# Patient Record
Sex: Female | Born: 1965 | Race: White | Hispanic: No | Marital: Married | Smoking: Never smoker
Health system: Southern US, Community
[De-identification: ages and names within clinical notes are randomized; demographics above are authoritative.]

## PROBLEM LIST (undated history)

## (undated) DIAGNOSIS — K219 Gastro-esophageal reflux disease without esophagitis: Secondary | ICD-10-CM

## (undated) DIAGNOSIS — Q513 Bicornate uterus: Secondary | ICD-10-CM

## (undated) DIAGNOSIS — E079 Disorder of thyroid, unspecified: Secondary | ICD-10-CM

## (undated) DIAGNOSIS — E119 Type 2 diabetes mellitus without complications: Secondary | ICD-10-CM

## (undated) HISTORY — DX: Gastro-esophageal reflux disease without esophagitis: K21.9

## (undated) HISTORY — PX: BREAST SURGERY: SHX581

## (undated) HISTORY — DX: Type 2 diabetes mellitus without complications: E11.9

## (undated) HISTORY — DX: Disorder of thyroid, unspecified: E07.9

## (undated) HISTORY — PX: BREAST EXCISIONAL BIOPSY: SUR124

## (undated) HISTORY — PX: WISDOM TOOTH EXTRACTION: SHX21

## (undated) HISTORY — DX: Bicornate uterus: Q51.3

---

## 1988-08-14 HISTORY — PX: CHOLECYSTECTOMY: SHX55

## 1992-08-14 HISTORY — PX: TUBAL LIGATION: SHX77

## 1994-08-14 HISTORY — PX: ENDOMETRIAL ABLATION: SHX621

## 1998-03-25 ENCOUNTER — Other Ambulatory Visit: Admission: RE | Admit: 1998-03-25 | Discharge: 1998-03-25 | Payer: Self-pay | Admitting: Gynecology

## 2000-01-18 ENCOUNTER — Other Ambulatory Visit: Admission: RE | Admit: 2000-01-18 | Discharge: 2000-01-18 | Payer: Self-pay | Admitting: Gynecology

## 2000-04-27 ENCOUNTER — Encounter: Admission: RE | Admit: 2000-04-27 | Discharge: 2000-04-27 | Payer: Self-pay | Admitting: Family Medicine

## 2000-04-27 ENCOUNTER — Encounter: Payer: Self-pay | Admitting: Family Medicine

## 2000-05-03 ENCOUNTER — Encounter: Payer: Self-pay | Admitting: Family Medicine

## 2000-05-03 ENCOUNTER — Encounter: Admission: RE | Admit: 2000-05-03 | Discharge: 2000-05-03 | Payer: Self-pay | Admitting: Family Medicine

## 2000-10-30 ENCOUNTER — Encounter: Payer: Self-pay | Admitting: Family Medicine

## 2000-10-30 ENCOUNTER — Encounter: Admission: RE | Admit: 2000-10-30 | Discharge: 2000-10-30 | Payer: Self-pay | Admitting: Family Medicine

## 2001-05-07 ENCOUNTER — Other Ambulatory Visit: Admission: RE | Admit: 2001-05-07 | Discharge: 2001-05-07 | Payer: Self-pay | Admitting: Gynecology

## 2001-05-21 ENCOUNTER — Encounter: Admission: RE | Admit: 2001-05-21 | Discharge: 2001-05-21 | Payer: Self-pay | Admitting: Family Medicine

## 2001-05-21 ENCOUNTER — Encounter: Payer: Self-pay | Admitting: Family Medicine

## 2002-07-01 ENCOUNTER — Encounter: Admission: RE | Admit: 2002-07-01 | Discharge: 2002-07-01 | Payer: Self-pay | Admitting: Family Medicine

## 2002-07-01 ENCOUNTER — Encounter: Payer: Self-pay | Admitting: Family Medicine

## 2003-02-18 ENCOUNTER — Other Ambulatory Visit: Admission: RE | Admit: 2003-02-18 | Discharge: 2003-02-18 | Payer: Self-pay | Admitting: Gynecology

## 2003-04-07 ENCOUNTER — Encounter: Admission: RE | Admit: 2003-04-07 | Discharge: 2003-04-07 | Payer: Self-pay | Admitting: Gynecology

## 2003-04-07 ENCOUNTER — Encounter: Payer: Self-pay | Admitting: Gynecology

## 2003-08-05 ENCOUNTER — Encounter: Admission: RE | Admit: 2003-08-05 | Discharge: 2003-08-05 | Payer: Self-pay | Admitting: Gynecology

## 2004-09-07 ENCOUNTER — Other Ambulatory Visit: Admission: RE | Admit: 2004-09-07 | Discharge: 2004-09-07 | Payer: Self-pay | Admitting: Gynecology

## 2004-09-13 ENCOUNTER — Encounter: Admission: RE | Admit: 2004-09-13 | Discharge: 2004-09-13 | Payer: Self-pay | Admitting: Gynecology

## 2005-09-19 ENCOUNTER — Encounter: Admission: RE | Admit: 2005-09-19 | Discharge: 2005-09-19 | Payer: Self-pay | Admitting: Family Medicine

## 2005-10-04 ENCOUNTER — Other Ambulatory Visit: Admission: RE | Admit: 2005-10-04 | Discharge: 2005-10-04 | Payer: Self-pay | Admitting: Gynecology

## 2006-10-17 ENCOUNTER — Encounter: Admission: RE | Admit: 2006-10-17 | Discharge: 2006-10-17 | Payer: Self-pay | Admitting: Family Medicine

## 2006-10-17 ENCOUNTER — Other Ambulatory Visit: Admission: RE | Admit: 2006-10-17 | Discharge: 2006-10-17 | Payer: Self-pay | Admitting: Gynecology

## 2006-11-21 ENCOUNTER — Encounter: Admission: RE | Admit: 2006-11-21 | Discharge: 2006-11-21 | Payer: Self-pay | Admitting: Family Medicine

## 2007-10-22 ENCOUNTER — Encounter: Admission: RE | Admit: 2007-10-22 | Discharge: 2007-10-22 | Payer: Self-pay | Admitting: Family Medicine

## 2007-11-01 ENCOUNTER — Other Ambulatory Visit: Admission: RE | Admit: 2007-11-01 | Discharge: 2007-11-01 | Payer: Self-pay | Admitting: Gynecology

## 2008-11-24 ENCOUNTER — Encounter: Admission: RE | Admit: 2008-11-24 | Discharge: 2008-11-24 | Payer: Self-pay | Admitting: Family Medicine

## 2008-12-11 ENCOUNTER — Encounter: Payer: Self-pay | Admitting: Women's Health

## 2008-12-11 ENCOUNTER — Other Ambulatory Visit: Admission: RE | Admit: 2008-12-11 | Discharge: 2008-12-11 | Payer: Self-pay | Admitting: Gynecology

## 2008-12-11 ENCOUNTER — Ambulatory Visit: Payer: Self-pay | Admitting: Women's Health

## 2009-03-31 ENCOUNTER — Encounter: Admission: RE | Admit: 2009-03-31 | Discharge: 2009-03-31 | Payer: Self-pay | Admitting: Internal Medicine

## 2009-12-16 ENCOUNTER — Encounter: Admission: RE | Admit: 2009-12-16 | Discharge: 2009-12-16 | Payer: Self-pay | Admitting: Family Medicine

## 2010-01-13 ENCOUNTER — Ambulatory Visit: Payer: Self-pay | Admitting: Women's Health

## 2010-01-13 ENCOUNTER — Other Ambulatory Visit: Admission: RE | Admit: 2010-01-13 | Discharge: 2010-01-13 | Payer: Self-pay | Admitting: Gynecology

## 2010-03-31 ENCOUNTER — Encounter: Admission: RE | Admit: 2010-03-31 | Discharge: 2010-03-31 | Payer: Self-pay | Admitting: Internal Medicine

## 2010-09-04 ENCOUNTER — Encounter: Payer: Self-pay | Admitting: Family Medicine

## 2011-02-06 ENCOUNTER — Other Ambulatory Visit: Payer: Self-pay | Admitting: Gynecology

## 2011-02-06 ENCOUNTER — Other Ambulatory Visit: Payer: Self-pay | Admitting: Obstetrics and Gynecology

## 2011-02-06 ENCOUNTER — Other Ambulatory Visit (HOSPITAL_COMMUNITY)
Admission: RE | Admit: 2011-02-06 | Discharge: 2011-02-06 | Disposition: A | Discharge status: Home / Self Care | Payer: 59 | Source: Ambulatory Visit | Attending: Gynecology | Admitting: Gynecology

## 2011-02-06 ENCOUNTER — Encounter (INDEPENDENT_AMBULATORY_CARE_PROVIDER_SITE_OTHER): Payer: 59 | Admitting: Women's Health

## 2011-02-06 DIAGNOSIS — Z1231 Encounter for screening mammogram for malignant neoplasm of breast: Secondary | ICD-10-CM

## 2011-02-06 DIAGNOSIS — Z01419 Encounter for gynecological examination (general) (routine) without abnormal findings: Secondary | ICD-10-CM

## 2011-02-06 DIAGNOSIS — Z124 Encounter for screening for malignant neoplasm of cervix: Secondary | ICD-10-CM | POA: Insufficient documentation

## 2011-02-06 DIAGNOSIS — Z833 Family history of diabetes mellitus: Secondary | ICD-10-CM

## 2011-02-06 DIAGNOSIS — N951 Menopausal and female climacteric states: Secondary | ICD-10-CM

## 2011-02-06 DIAGNOSIS — Z13220 Encounter for screening for lipoid disorders: Secondary | ICD-10-CM

## 2011-02-09 ENCOUNTER — Other Ambulatory Visit: Payer: Self-pay | Admitting: Gynecology

## 2011-02-09 ENCOUNTER — Ambulatory Visit
Admission: RE | Admit: 2011-02-09 | Discharge: 2011-02-09 | Disposition: A | Discharge status: Home / Self Care | Payer: 59 | Source: Ambulatory Visit | Attending: Obstetrics and Gynecology | Admitting: Obstetrics and Gynecology

## 2011-02-09 DIAGNOSIS — Z1231 Encounter for screening mammogram for malignant neoplasm of breast: Secondary | ICD-10-CM

## 2011-04-11 ENCOUNTER — Other Ambulatory Visit: Payer: Self-pay | Admitting: Internal Medicine

## 2011-04-11 DIAGNOSIS — E041 Nontoxic single thyroid nodule: Secondary | ICD-10-CM

## 2011-04-18 ENCOUNTER — Ambulatory Visit
Admission: RE | Admit: 2011-04-18 | Discharge: 2011-04-18 | Disposition: A | Discharge status: Home / Self Care | Payer: 59 | Source: Ambulatory Visit | Attending: Internal Medicine | Admitting: Internal Medicine

## 2011-04-18 DIAGNOSIS — E041 Nontoxic single thyroid nodule: Secondary | ICD-10-CM

## 2012-01-10 ENCOUNTER — Telehealth: Payer: Self-pay | Admitting: *Deleted

## 2012-01-10 MED ORDER — PROGESTERONE MICRONIZED 200 MG PO CAPS: ORAL_CAPSULE | ORAL | Status: DC

## 2012-01-10 NOTE — Telephone Encounter (Signed)
Pt called requesting refill on her Prometrium 200 mg 1 po daily day 1 through 10 each month. Pt annual scheduled 02/19/12

## 2012-02-19 ENCOUNTER — Ambulatory Visit (INDEPENDENT_AMBULATORY_CARE_PROVIDER_SITE_OTHER): Payer: 59 | Admitting: Women's Health

## 2012-02-19 ENCOUNTER — Encounter: Payer: Self-pay | Admitting: Women's Health

## 2012-02-19 VITALS — BP 136/86 | Ht 64.25 in | Wt 253.0 lb

## 2012-02-19 DIAGNOSIS — E039 Hypothyroidism, unspecified: Secondary | ICD-10-CM

## 2012-02-19 DIAGNOSIS — Q513 Bicornate uterus: Secondary | ICD-10-CM

## 2012-02-19 DIAGNOSIS — Z01419 Encounter for gynecological examination (general) (routine) without abnormal findings: Secondary | ICD-10-CM

## 2012-02-19 DIAGNOSIS — N912 Amenorrhea, unspecified: Secondary | ICD-10-CM

## 2012-02-19 DIAGNOSIS — Z13220 Encounter for screening for lipoid disorders: Secondary | ICD-10-CM

## 2012-02-19 LAB — LIPID PANEL
Cholesterol: 172 mg/dL (ref 0–200)
LDL Cholesterol: 106 mg/dL — ABNORMAL HIGH (ref 0–99)
Total CHOL/HDL Ratio: 4.5 Ratio
Triglycerides: 139 mg/dL (ref <150)
VLDL: 28 mg/dL (ref 0–40)

## 2012-02-19 LAB — CBC WITH DIFFERENTIAL/PLATELET
Basophils Absolute: 0 10*3/uL (ref 0.0–0.1)
Basophils Relative: 1 % (ref 0–1)
Eosinophils Absolute: 0.1 10*3/uL (ref 0.0–0.7)
MCH: 28.8 pg (ref 26.0–34.0)
MCHC: 32.7 g/dL (ref 30.0–36.0)
Neutro Abs: 3.8 10*3/uL (ref 1.7–7.7)
Neutrophils Relative %: 54 % (ref 43–77)
Platelets: 294 10*3/uL (ref 150–400)
RDW: 14.4 % (ref 11.5–15.5)

## 2012-02-19 MED ORDER — PROGESTERONE MICRONIZED 200 MG PO CAPS: ORAL_CAPSULE | ORAL | Status: DC

## 2012-02-19 NOTE — Progress Notes (Signed)
Amber Choi 02-04-1966 [id number]    History:    The patient presents for annual exam.  Amenorrheic, ablation in 97, bicornuate uterus, monthly cycles until one year ago, using Prometrium 200 mg day one through 12, no cycle last 2 months. History of all normal Paps and mammograms.. Hypothyroid on Synthroid 112 mcg-Amber Choi manages.   Past medical history, past surgical history, family history and social history were all reviewed and documented in the EPIC chart. History of a normal renal ultrasound. 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 Daughter Amber Choi living in [place] for her job getting married this year.   ROS:  A  ROS was performed and pertinent positives and negatives are included in the history.  Exam:  Filed Vitals:   02/19/12 0827  BP: 136/86    General appearance:  Normal Head/Neck:  Normal, without cervical or supraclavicular adenopathy. Thyroid:  Symmetrical, normal in size, without palpable masses or nodularity. Respiratory  Effort:  Normal  Auscultation:  Clear without wheezing or rhonchi Cardiovascular  Auscultation:  Regular rate, without rubs, murmurs or gallops  Edema/varicosities:  Not grossly evident Abdominal  Soft,nontender, without masses, guarding or rebound.  Liver/spleen:  No organomegaly noted  Hernia:  None appreciated  Skin  Inspection:  Grossly normal  Palpation:  Grossly normal Neurologic/psychiatric  Orientation:  Normal with appropriate conversation.  Mood/affect:  Normal  Genitourinary    Breasts: Examined lying and sitting.     Right: Without masses, retractions, discharge or axillary adenopathy.     Left: Without masses, retractions, discharge or axillary adenopathy.   Inguinal/mons:  Normal without inguinal adenopathy  External genitalia:  Normal  BUS/Urethra/Skene's glands:  Normal  Bladder:  Normal  Vagina:  Normal  Cervix:  Normal  Uterus:   normal in size, shape and contour.  Midline and  mobile  Adnexa/parametria:     Rt: Without masses or tenderness.   Lt: Without masses or tenderness.  Anus and perineum: Normal  Digital rectal exam: Normal sphincter tone without palpated masses or tenderness  Assessment/Plan:  46 y.o. M WF G2 P2  for annual exam amenorrheic, last cycle April 2013, no changes in menopausal symptoms.  Amenorrhea after Prometrium/history of ablation 97 Hypothyroid-Synthroid 112 mcg-Amber Choi Obese  Plan: CBC, lipid panel, FSH, UA. No Pap history of normal Paps, new screening guidelines reviewed. SBE's, annual mammogram which is due will schedule, reviewed importance of increasing exercise, decreasing calories for weight loss. Vitamin D 2000 daily and calcium rich diet encouraged. Prometrium 200 mg day one through 12 at at bedtime if [facility] is not elevated. Prescription given with instructions. Will call with lab results. If FSH is elevated will check a bone density this year. Blood pressure slightly elevated will check away from office states has had normal blood pressures in the past.    Harrington Challenger [facility], 9:10 AM 02/19/2012

## 2012-02-19 NOTE — Patient Instructions (Addendum)
Vit d daily   Health Maintenance, Females A healthy lifestyle and preventative care can promote health and wellness.  Maintain regular health, dental, and eye exams.   Eat a healthy diet. Foods like vegetables, fruits, whole grains, low-fat dairy products, and lean protein foods contain the nutrients you need without too many calories. Decrease your intake of foods high in solid fats, added sugars, and salt. Get information about a proper diet from your caregiver, if necessary.   Regular physical exercise is one of the most important things you can do for your health. Most adults should get at least 150 minutes of moderate-intensity exercise (any activity that increases your heart rate and causes you to sweat) each week. In addition, most adults need muscle-strengthening exercises on 2 or more days a week.    Maintain a healthy weight. The body mass index (BMI) is a screening tool to identify possible weight problems. It provides an estimate of body fat based on height and weight. Your caregiver can help determine your BMI, and can help you achieve or maintain a healthy weight. For adults 20 years and older:   A BMI below 18.5 is considered underweight.   A BMI of 18.5 to 24.9 is normal.   A BMI of 25 to 29.9 is considered overweight.   A BMI of 30 and above is considered obese.   Maintain normal blood lipids and cholesterol by exercising and minimizing your intake of saturated fat. Eat a balanced diet with plenty of fruits and vegetables. Blood tests for lipids and cholesterol should begin at age 65 and be repeated every 5 years. If your lipid or cholesterol levels are high, you are over 50, or you are a high risk for heart disease, you may need your cholesterol levels checked more frequently.Ongoing high lipid and cholesterol levels should be treated with medicines if diet and exercise are not effective.   If you smoke, find out from your caregiver how to quit. If you do not use tobacco, do  not start.   If you are pregnant, do not drink alcohol. If you are breastfeeding, be very cautious about drinking alcohol. If you are not pregnant and choose to drink alcohol, do not exceed 1 drink per day. One drink is considered to be 12 ounces (355 mL) of beer, 5 ounces (148 mL) of wine, or 1.5 ounces (44 mL) of liquor.   Avoid use of street drugs. Do not share needles with anyone. Ask for help if you need support or instructions about stopping the use of drugs.   High blood pressure causes heart disease and increases the risk of stroke. Blood pressure should be checked at least every 1 to 2 years. Ongoing high blood pressure should be treated with medicines, if weight loss and exercise are not effective.   If you are 29 to 47 years old, ask your caregiver if you should take aspirin to prevent strokes.   Diabetes screening involves taking a blood sample to check your fasting blood sugar level. This should be done once every 3 years, after age 6, if you are within normal weight and without risk factors for diabetes. Testing should be considered at a younger age or be carried out more frequently if you are overweight and have at least 1 risk factor for diabetes.   Breast cancer screening is essential preventative care for women. You should practice "breast self-awareness." This means understanding the normal appearance and feel of your breasts and may include breast self-examination. Any  changes detected, no matter how small, should be reported to a caregiver. Women in their 64s and 30s should have a clinical breast exam (CBE) by a caregiver as part of a regular health exam every 1 to 3 years. After age 25, women should have a CBE every year. Starting at age 45, women should consider having a mammogram (breast X-ray) every year. Women who have a family history of breast cancer should talk to their caregiver about genetic screening. Women at a high risk of breast cancer should talk to their caregiver  about having an MRI and a mammogram every year.   The Pap test is a screening test for cervical cancer. Women should have a Pap test starting at age 71. Between ages 24 and 71, Pap tests should be repeated every 2 years. Beginning at age 52, you should have a Pap test every 3 years as long as the past 3 Pap tests have been normal. If you had a hysterectomy for a problem that was not cancer or a condition that could lead to cancer, then you no longer need Pap tests. If you are between ages 59 and 2, and you have had normal Pap tests going back 10 years, you no longer need Pap tests. If you have had past treatment for cervical cancer or a condition that could lead to cancer, you need Pap tests and screening for cancer for at least 20 years after your treatment. If Pap tests have been discontinued, risk factors (such as a new sexual partner) need to be reassessed to determine if screening should be resumed. Some women have medical problems that increase the chance of getting cervical cancer. In these cases, your caregiver may recommend more frequent screening and Pap tests.   The human papillomavirus (HPV) test is an additional test that may be used for cervical cancer screening. The HPV test looks for the virus that can cause the cell changes on the cervix. The cells collected during the Pap test can be tested for HPV. The HPV test could be used to screen women aged 32 years and older, and should be used in women of any age who have unclear Pap test results. After the age of 42, women should have HPV testing at the same frequency as a Pap test.   Colorectal cancer can be detected and often prevented. Most routine colorectal cancer screening begins at the age of 21 and continues through age 60. However, your caregiver may recommend screening at an earlier age if you have risk factors for colon cancer. On a yearly basis, your caregiver may provide home test kits to check for hidden blood in the stool. Use of a  small camera at the end of a tube, to directly examine the colon (sigmoidoscopy or colonoscopy), can detect the earliest forms of colorectal cancer. Talk to your caregiver about this at age 35, when routine screening begins. Direct examination of the colon should be repeated every 5 to 10 years through age 20, unless early forms of pre-cancerous polyps or small growths are found.   Hepatitis C blood testing is recommended for all people born from [birth year] through 1966 and any individual with known risks for hepatitis C.   Practice safe sex. Use condoms and avoid high-risk sexual practices to reduce the spread of sexually transmitted infections (STIs). Sexually active women aged 88 and younger should be checked for Chlamydia, which is a common sexually transmitted infection. Older women with new or multiple partners should also be tested  for Chlamydia. Testing for other STIs is recommended if you are sexually active and at increased risk.   Osteoporosis is a disease in which the bones lose minerals and strength with aging. This can result in serious bone fractures. The risk of osteoporosis can be identified using a bone density scan. Women ages 41 and over and women at risk for fractures or osteoporosis should discuss screening with their caregivers. Ask your caregiver whether you should be taking a calcium supplement or vitamin D to reduce the rate of osteoporosis.   Menopause can be associated with physical symptoms and risks. Hormone replacement therapy is available to decrease symptoms and risks. You should talk to your caregiver about whether hormone replacement therapy is right for you.   Use sunscreen with a sun protection factor (SPF) of 30 or greater. Apply sunscreen liberally and repeatedly throughout the day. You should seek shade when your shadow is shorter than you. Protect yourself by wearing long sleeves, pants, a wide-brimmed hat, and sunglasses year round, whenever you are outdoors.   Notify  your caregiver of new moles or changes in moles, especially if there is a change in shape or color. Also notify your caregiver if a mole is larger than the size of a pencil eraser.   Stay current with your immunizations.  Document Released: 02/13/2011 Document Revised: 07/20/2011 Document Reviewed: 02/13/2011 [facility] Patient Information 2012 [place], [place].

## 2012-02-20 LAB — URINALYSIS W MICROSCOPIC + REFLEX CULTURE
Bacteria, UA: NONE SEEN
Casts: NONE SEEN
Hgb urine dipstick: NEGATIVE
Ketones, ur: NEGATIVE mg/dL
Nitrite: NEGATIVE
Protein, ur: NEGATIVE mg/dL
Urobilinogen, UA: 0.2 mg/dL (ref 0.0–1.0)
pH: 6 (ref 5.0–8.0)

## 2012-02-20 LAB — FOLLICLE STIMULATING HORMONE: FSH: 20.2 m[IU]/mL

## 2012-03-14 ENCOUNTER — Other Ambulatory Visit: Payer: Self-pay | Admitting: Family Medicine

## 2012-03-14 DIAGNOSIS — Z1231 Encounter for screening mammogram for malignant neoplasm of breast: Secondary | ICD-10-CM

## 2012-04-09 ENCOUNTER — Other Ambulatory Visit: Payer: Self-pay | Admitting: Internal Medicine

## 2012-04-09 ENCOUNTER — Ambulatory Visit
Admission: RE | Admit: 2012-04-09 | Discharge: 2012-04-09 | Disposition: A | Discharge status: Home / Self Care | Payer: 59 | Source: Ambulatory Visit | Attending: Family Medicine | Admitting: Family Medicine

## 2012-04-09 DIAGNOSIS — E041 Nontoxic single thyroid nodule: Secondary | ICD-10-CM

## 2012-04-09 DIAGNOSIS — Z1231 Encounter for screening mammogram for malignant neoplasm of breast: Secondary | ICD-10-CM

## 2012-05-13 ENCOUNTER — Other Ambulatory Visit: Payer: 59

## 2012-05-14 ENCOUNTER — Ambulatory Visit
Admission: RE | Admit: 2012-05-14 | Discharge: 2012-05-14 | Disposition: A | Discharge status: Home / Self Care | Payer: 59 | Source: Ambulatory Visit | Attending: Internal Medicine | Admitting: Internal Medicine

## 2012-05-14 DIAGNOSIS — E041 Nontoxic single thyroid nodule: Secondary | ICD-10-CM

## 2012-05-15 ENCOUNTER — Other Ambulatory Visit: Payer: Self-pay | Admitting: Internal Medicine

## 2012-05-15 DIAGNOSIS — E041 Nontoxic single thyroid nodule: Secondary | ICD-10-CM

## 2012-05-27 ENCOUNTER — Other Ambulatory Visit: Payer: 59

## 2013-03-27 ENCOUNTER — Other Ambulatory Visit: Payer: Self-pay | Admitting: Internal Medicine

## 2013-03-27 ENCOUNTER — Other Ambulatory Visit: Payer: 59

## 2013-03-27 DIAGNOSIS — E041 Nontoxic single thyroid nodule: Secondary | ICD-10-CM

## 2013-04-01 ENCOUNTER — Ambulatory Visit
Admission: RE | Admit: 2013-04-01 | Discharge: 2013-04-01 | Disposition: A | Discharge status: Home / Self Care | Payer: 59 | Source: Ambulatory Visit | Attending: Internal Medicine | Admitting: Internal Medicine

## 2013-04-01 DIAGNOSIS — E041 Nontoxic single thyroid nodule: Secondary | ICD-10-CM

## 2013-04-09 ENCOUNTER — Other Ambulatory Visit: Payer: Self-pay

## 2013-04-09 DIAGNOSIS — Z1231 Encounter for screening mammogram for malignant neoplasm of breast: Secondary | ICD-10-CM

## 2013-04-23 ENCOUNTER — Ambulatory Visit: Payer: 59

## 2013-05-06 ENCOUNTER — Encounter: Payer: Self-pay | Admitting: Women's Health

## 2013-05-06 ENCOUNTER — Ambulatory Visit (INDEPENDENT_AMBULATORY_CARE_PROVIDER_SITE_OTHER): Payer: 59 | Admitting: Women's Health

## 2013-05-06 ENCOUNTER — Other Ambulatory Visit (HOSPITAL_COMMUNITY)
Admission: RE | Admit: 2013-05-06 | Discharge: 2013-05-06 | Disposition: A | Discharge status: Home / Self Care | Payer: 59 | Source: Ambulatory Visit | Attending: Gynecology | Admitting: Gynecology

## 2013-05-06 VITALS — BP 110/78 | Ht 64.25 in | Wt 262.0 lb

## 2013-05-06 DIAGNOSIS — N912 Amenorrhea, unspecified: Secondary | ICD-10-CM

## 2013-05-06 DIAGNOSIS — I10 Essential (primary) hypertension: Secondary | ICD-10-CM

## 2013-05-06 DIAGNOSIS — Z01419 Encounter for gynecological examination (general) (routine) without abnormal findings: Secondary | ICD-10-CM

## 2013-05-06 MED ORDER — PROGESTERONE MICRONIZED 200 MG PO CAPS: ORAL_CAPSULE | ORAL | Status: DC

## 2013-05-06 NOTE — Patient Instructions (Addendum)
Health Recommendations for Postmenopausal Women Respected and ongoing research has looked at the most common causes of death, disability, and poor quality of life in postmenopausal women. The causes include heart disease, diseases of blood vessels, diabetes, depression, cancer, and bone loss (osteoporosis). Many things can be done to help lower the chances of developing these and other common problems: CARDIOVASCULAR DISEASE Heart Disease: A heart attack is a medical emergency. Know the signs and symptoms of a heart attack. Below are things women can do to reduce their risk for heart disease.   Do not smoke. If you smoke, quit.  Aim for a healthy weight. Being overweight causes many preventable deaths. Eat a healthy and balanced diet and drink an adequate amount of liquids.  Get moving. Make a commitment to be more physically active. Aim for 30 minutes of activity on most, if not all days of the week.  Eat for heart health. Choose a diet that is low in saturated fat and cholesterol and eliminate trans fat. Include whole grains, vegetables, and fruits. Read and understand the labels on food containers before buying.  Know your numbers. Ask your caregiver to check your blood pressure, cholesterol (total, HDL, LDL, triglycerides) and blood glucose. Work with your caregiver on improving your entire clinical picture.  High blood pressure. Limit or stop your table salt intake (try salt substitute and food seasonings). Avoid salty foods and drinks. Read labels on food containers before buying. Eating well and exercising can help control high blood pressure. STROKE  Stroke is a medical emergency. Stroke may be the result of a blood clot in a blood vessel in the brain or by a brain hemorrhage (bleeding). Know the signs and symptoms of a stroke. To lower the risk of developing a stroke:  Avoid fatty foods.  Quit smoking.  Control your diabetes, blood pressure, and irregular heart rate. THROMBOPHLEBITIS  (BLOOD CLOT) OF THE LEG  Becoming overweight and leading a stationary lifestyle may also contribute to developing blood clots. Controlling your diet and exercising will help lower the risk of developing blood clots. CANCER SCREENING  Breast Cancer: Take steps to reduce your risk of breast cancer.  You should practice "breast self-awareness." This means understanding the normal appearance and feel of your breasts and should include breast self-examination. Any changes detected, no matter how small, should be reported to your caregiver.  After age 40, you should have a clinical breast exam (CBE) every year.  Starting at age 40, you should consider having a mammogram (breast X-ray) every year.  If you have a family history of breast cancer, talk to your caregiver about genetic screening.  If you are at high risk for breast cancer, talk to your caregiver about having an MRI and a mammogram every year.  Intestinal or Stomach Cancer: Tests to consider are a rectal exam, fecal occult blood, sigmoidoscopy, and colonoscopy. Women who are high risk may need to be screened at an earlier age and more often.  Cervical Cancer:  Beginning at age 30, you should have a Pap test every 3 years as long as the past 3 Pap tests have been normal.  If you have had past treatment for cervical cancer or a condition that could lead to cancer, you need Pap tests and screening for cancer for at least 20 years after your treatment.  If you had a hysterectomy for a problem that was not cancer or a condition that could lead to cancer, then you no longer need Pap tests.    If you are between ages 65 and 70, and you have had normal Pap tests going back 10 years, you no longer need Pap tests.  If Pap tests have been discontinued, risk factors (such as a new sexual partner) need to be reassessed to determine if screening should be resumed.  Some medical problems can increase the chance of getting cervical cancer. In these  cases, your caregiver may recommend more frequent screening and Pap tests.  Uterine Cancer: If you have vaginal bleeding after reaching menopause, you should notify your caregiver.  Ovarian cancer: Other than yearly pelvic exams, there are no reliable tests available to screen for ovarian cancer at this time except for yearly pelvic exams.  Lung Cancer: Yearly chest X-rays can detect lung cancer and should be done on high risk women, such as cigarette smokers and women with chronic lung disease (emphysema).  Skin Cancer: A complete body skin exam should be done at your yearly examination. Avoid overexposure to the sun and ultraviolet light lamps. Use a strong sun block cream when in the sun. All of these things are important in lowering the risk of skin cancer. MENOPAUSE Menopause Symptoms: Hormone therapy products are effective for treating symptoms associated with menopause:  Moderate to severe hot flashes.  Night sweats.  Mood swings.  Headaches.  Tiredness.  Loss of sex drive.  Insomnia.  Other symptoms. Hormone replacement carries certain risks, especially in older women. Women who use or are thinking about using estrogen or estrogen with progestin treatments should discuss that with their caregiver. Your caregiver will help you understand the benefits and risks. The ideal dose of hormone replacement therapy is not known. The Food and Drug Administration (FDA) has concluded that hormone therapy should be used only at the lowest doses and for the shortest amount of time to reach treatment goals.  OSTEOPOROSIS Protecting Against Bone Loss and Preventing Fracture: If you use hormone therapy for prevention of bone loss (osteoporosis), the risks for bone loss must outweigh the risk of the therapy. Ask your caregiver about other medications known to be safe and effective for preventing bone loss and fractures. To guard against bone loss or fractures, the following is recommended:  If  you are less than age 50, take 1000 mg of calcium and at least 600 mg of Vitamin D per day.  If you are greater than age 50 but less than age 70, take 1200 mg of calcium and at least 600 mg of Vitamin D per day.  If you are greater than age 70, take 1200 mg of calcium and at least 800 mg of Vitamin D per day. Smoking and excessive alcohol intake increases the risk of osteoporosis. Eat foods rich in calcium and vitamin D and do weight bearing exercises several times a week as your caregiver suggests. DIABETES Diabetes Melitus: If you have Type I or Type 2 diabetes, you should keep your blood sugar under control with diet, exercise and recommended medication. Avoid too many sweets, starchy and fatty foods. Being overweight can make control more difficult. COGNITION AND MEMORY Cognition and Memory: Menopausal hormone therapy is not recommended for the prevention of cognitive disorders such as Alzheimer's disease or memory loss.  DEPRESSION  Depression may occur at any age, but is common in elderly women. The reasons may be because of physical, medical, social (loneliness), or financial problems and needs. If you are experiencing depression because of medical problems and control of symptoms, talk to your caregiver about this. Physical activity and   exercise may help with mood and sleep. Community and volunteer involvement may help your sense of value and worth. If you have depression and you feel that the problem is getting worse or becoming severe, talk to your caregiver about treatment options that are best for you. ACCIDENTS  Accidents are common and can be serious in the elderly woman. Prepare your house to prevent accidents. Eliminate throw rugs, place hand bars in the bath, shower and toilet areas. Avoid wearing high heeled shoes or walking on wet, snowy, and icy areas. Limit or stop driving if you have vision or hearing problems, or you feel you are unsteady with you movements and  reflexes. HEPATITIS C Hepatitis C is a type of viral infection affecting the liver. It is spread mainly through contact with blood from an infected person. It can be treated, but if left untreated, it can lead to severe liver damage over years. Many people who are infected do not know that the virus is in their blood. If you are a "baby-boomer", it is recommended that you have one screening test for Hepatitis C. IMMUNIZATIONS  Several immunizations are important to consider having during your senior years, including:   Tetanus, diptheria, and pertussis booster shot.  Influenza every year before the flu season begins.  Pneumonia vaccine.  Shingles vaccine.  Others as indicated based on your specific needs. Talk to your caregiver about these. Document Released: 09/22/2005 Document Revised: 07/17/2012 Document Reviewed: 05/18/2008 [employer] Patient Information 2014 [employer].  

## 2013-05-06 NOTE — Progress Notes (Signed)
Amber Choi 13-Jul-1966 [id number]    History:    The patient presents for annual exam.  Amenorrheic ablation 1997. Bicornate uterus. Had been taking Prometrium 200 day one through 12 every other month with light cycle after, stopped October 2013 forgot to take. Pap and mammogram history. FSH 22,013. Hemoglobin A1c 6.7 life insurance physical 03/2013 lipid panel negative liver enzymes normal random glucose 96.  Past medical history, past surgical history, family history and social history were all reviewed and documented in the EPIC chart. Hypothyroid endocrinologist/hypertension-primary care manages. 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 Parents hypertension. Josh 25 social anxiety doing better. Sharlynn Oliphant now married living in [place].   ROS:  A  ROS was performed and pertinent positives and negatives are included in the history.  Exam:  Filed Vitals:   05/06/13 1542  BP: 110/78    General appearance:  Normal Head/Neck:  Normal, without cervical or supraclavicular adenopathy. Thyroid:  Symmetrical, normal in size, without palpable masses or nodularity. Respiratory  Effort:  Normal  Auscultation:  Clear without wheezing or rhonchi Cardiovascular  Auscultation:  Regular rate, without rubs, murmurs or gallops  Edema/varicosities:  Not grossly evident Abdominal  Soft,nontender, without masses, guarding or rebound.  Liver/spleen:  No organomegaly noted  Hernia:  None appreciated  Skin  Inspection:  Grossly normal  Palpation:  Grossly normal Neurologic/psychiatric  Orientation:  Normal with appropriate conversation.  Mood/affect:  Normal  Genitourinary    Breasts: Examined lying and sitting.     Right: Without masses, retractions, discharge or axillary adenopathy.     Left: Without masses, retractions, discharge or axillary adenopathy.   Inguinal/mons:  Normal without inguinal adenopathy  External genitalia:  Normal  BUS/Urethra/Skene's glands:  Normal  Bladder:   Normal  Vagina:  Normal  Cervix:  Normal  Uterus:   normal in size, shape and contour.  Midline and mobile  Adnexa/parametria:     Rt: Without masses or tenderness.   Lt: Without masses or tenderness.  Anus and perineum: Normal  Digital rectal exam: Normal sphincter tone without palpated masses or tenderness  Assessment/Plan:  47 y.o. MWF G2P2 for annual exam.   Hypothyroid-endocrinologist manages labs and meds Hypertension primary care manages labs and meds Endometrial ablation in 1997 amenorrheic/hot flushes Obesity  Plan: Instructed to followup with  endocrinologist  hemoglobin A1c 6.7 on life insurance labs. Reviewed importance of weight loss for health, Weight Watchers, [place], reviewed. SBE's, annual mammogram, 3-D tomography reviewed and encouraged, has dense breast noted on mammogram. FSH., Pap, Pap normal 2012, new screening guidelines reviewed.    Harrington Challenger [facility], 5:12 PM 05/06/2013

## 2013-05-07 ENCOUNTER — Encounter: Payer: Self-pay | Admitting: *Deleted

## 2013-05-07 ENCOUNTER — Encounter: Payer: Self-pay | Admitting: Women's Health

## 2013-05-07 ENCOUNTER — Ambulatory Visit
Admission: RE | Admit: 2013-05-07 | Discharge: 2013-05-07 | Disposition: A | Discharge status: Home / Self Care | Payer: 59 | Source: Ambulatory Visit

## 2013-05-07 DIAGNOSIS — Z1231 Encounter for screening mammogram for malignant neoplasm of breast: Secondary | ICD-10-CM

## 2013-05-07 LAB — FOLLICLE STIMULATING HORMONE: FSH: 44.3 m[IU]/mL

## 2013-05-14 DIAGNOSIS — E119 Type 2 diabetes mellitus without complications: Secondary | ICD-10-CM

## 2013-05-14 HISTORY — DX: Type 2 diabetes mellitus without complications: E11.9

## 2013-06-19 ENCOUNTER — Other Ambulatory Visit: Payer: Self-pay

## 2014-03-30 ENCOUNTER — Other Ambulatory Visit: Payer: Self-pay

## 2014-03-30 DIAGNOSIS — Z1231 Encounter for screening mammogram for malignant neoplasm of breast: Secondary | ICD-10-CM

## 2014-04-03 ENCOUNTER — Other Ambulatory Visit (HOSPITAL_COMMUNITY): Payer: 59

## 2014-04-10 ENCOUNTER — Other Ambulatory Visit (HOSPITAL_COMMUNITY): Payer: Self-pay | Admitting: Internal Medicine

## 2014-04-10 ENCOUNTER — Ambulatory Visit (HOSPITAL_COMMUNITY): Payer: 59 | Attending: Internal Medicine | Admitting: Cardiology

## 2014-04-10 DIAGNOSIS — R011 Cardiac murmur, unspecified: Secondary | ICD-10-CM | POA: Insufficient documentation

## 2014-04-10 DIAGNOSIS — E119 Type 2 diabetes mellitus without complications: Secondary | ICD-10-CM | POA: Insufficient documentation

## 2014-04-10 DIAGNOSIS — E785 Hyperlipidemia, unspecified: Secondary | ICD-10-CM | POA: Diagnosis not present

## 2014-04-10 DIAGNOSIS — E669 Obesity, unspecified: Secondary | ICD-10-CM | POA: Diagnosis not present

## 2014-04-10 NOTE — Progress Notes (Signed)
Echo performed. 

## 2014-05-08 ENCOUNTER — Ambulatory Visit
Admission: RE | Admit: 2014-05-08 | Discharge: 2014-05-08 | Disposition: A | Discharge status: Home / Self Care | Payer: 59 | Source: Ambulatory Visit

## 2014-05-08 DIAGNOSIS — Z1231 Encounter for screening mammogram for malignant neoplasm of breast: Secondary | ICD-10-CM

## 2014-05-15 ENCOUNTER — Encounter: Payer: Self-pay | Admitting: Women's Health

## 2014-05-19 ENCOUNTER — Ambulatory Visit (INDEPENDENT_AMBULATORY_CARE_PROVIDER_SITE_OTHER): Payer: 59 | Admitting: Women's Health

## 2014-05-19 ENCOUNTER — Encounter: Payer: Self-pay | Admitting: Women's Health

## 2014-05-19 VITALS — BP 130/84 | Ht 64.5 in | Wt 243.0 lb

## 2014-05-19 DIAGNOSIS — Z01419 Encounter for gynecological examination (general) (routine) without abnormal findings: Secondary | ICD-10-CM

## 2014-05-19 DIAGNOSIS — E119 Type 2 diabetes mellitus without complications: Secondary | ICD-10-CM | POA: Insufficient documentation

## 2014-05-19 NOTE — Patient Instructions (Signed)
Health Recommendations for Postmenopausal Women Respected and ongoing research has looked at the most common causes of death, disability, and poor quality of life in postmenopausal women. The causes include heart disease, diseases of blood vessels, diabetes, depression, cancer, and bone loss (osteoporosis). Many things can be done to help lower the chances of developing these and other common problems. CARDIOVASCULAR DISEASE Heart Disease: A heart attack is a medical emergency. Know the signs and symptoms of a heart attack. Below are things women can do to reduce their risk for heart disease.   Do not smoke. If you smoke, quit.  Aim for a healthy weight. Being overweight causes many preventable deaths. Eat a healthy and balanced diet and drink an adequate amount of liquids.  Get moving. Make a commitment to be more physically active. Aim for 30 minutes of activity on most, if not all days of the week.  Eat for heart health. Choose a diet that is low in saturated fat and cholesterol and eliminate trans fat. Include whole grains, vegetables, and fruits. Read and understand the labels on food containers before buying.  Know your numbers. Ask your caregiver to check your blood pressure, cholesterol (total, HDL, LDL, triglycerides) and blood glucose. Work with your caregiver on improving your entire clinical picture.  High blood pressure. Limit or stop your table salt intake (try salt substitute and food seasonings). Avoid salty foods and drinks. Read labels on food containers before buying. Eating well and exercising can help control high blood pressure. STROKE  Stroke is a medical emergency. Stroke may be the result of a blood clot in a blood vessel in the brain or by a brain hemorrhage (bleeding). Know the signs and symptoms of a stroke. To lower the risk of developing a stroke:  Avoid fatty foods.  Quit smoking.  Control your diabetes, blood pressure, and irregular heart rate. THROMBOPHLEBITIS  (BLOOD CLOT) OF THE LEG  Becoming overweight and leading a stationary lifestyle may also contribute to developing blood clots. Controlling your diet and exercising will help lower the risk of developing blood clots. CANCER SCREENING  Breast Cancer: Take steps to reduce your risk of breast cancer.  You should practice "breast self-awareness." This means understanding the normal appearance and feel of your breasts and should include breast self-examination. Any changes detected, no matter how small, should be reported to your caregiver.  After age 40, you should have a clinical breast exam (CBE) every year.  Starting at age 40, you should consider having a mammogram (breast X-ray) every year.  If you have a family history of breast cancer, talk to your caregiver about genetic screening.  If you are at high risk for breast cancer, talk to your caregiver about having an MRI and a mammogram every year.  Intestinal or Stomach Cancer: Tests to consider are a rectal exam, fecal occult blood, sigmoidoscopy, and colonoscopy. Women who are high risk may need to be screened at an earlier age and more often.  Cervical Cancer:  Beginning at age 30, you should have a Pap test every 3 years as long as the past 3 Pap tests have been normal.  If you have had past treatment for cervical cancer or a condition that could lead to cancer, you need Pap tests and screening for cancer for at least 20 years after your treatment.  If you had a hysterectomy for a problem that was not cancer or a condition that could lead to cancer, then you no longer need Pap tests.    If you are between ages 65 and 70, and you have had normal Pap tests going back 10 years, you no longer need Pap tests.  If Pap tests have been discontinued, risk factors (such as a new sexual partner) need to be reassessed to determine if screening should be resumed.  Some medical problems can increase the chance of getting cervical cancer. In these  cases, your caregiver may recommend more frequent screening and Pap tests.  Uterine Cancer: If you have vaginal bleeding after reaching menopause, you should notify your caregiver.  Ovarian Cancer: Other than yearly pelvic exams, there are no reliable tests available to screen for ovarian cancer at this time except for yearly pelvic exams.  Lung Cancer: Yearly chest X-rays can detect lung cancer and should be done on high risk women, such as cigarette smokers and women with chronic lung disease (emphysema).  Skin Cancer: A complete body skin exam should be done at your yearly examination. Avoid overexposure to the sun and ultraviolet light lamps. Use a strong sun block cream when in the sun. All of these things are important for lowering the risk of skin cancer. MENOPAUSE Menopause Symptoms: Hormone therapy products are effective for treating symptoms associated with menopause:  Moderate to severe hot flashes.  Night sweats.  Mood swings.  Headaches.  Tiredness.  Loss of sex drive.  Insomnia.  Other symptoms. Hormone replacement carries certain risks, especially in older women. Women who use or are thinking about using estrogen or estrogen with progestin treatments should discuss that with their caregiver. Your caregiver will help you understand the benefits and risks. The ideal dose of hormone replacement therapy is not known. The Food and Drug Administration (FDA) has concluded that hormone therapy should be used only at the lowest doses and for the shortest amount of time to reach treatment goals.  OSTEOPOROSIS Protecting Against Bone Loss and Preventing Fracture If you use hormone therapy for prevention of bone loss (osteoporosis), the risks for bone loss must outweigh the risk of the therapy. Ask your caregiver about other medications known to be safe and effective for preventing bone loss and fractures. To guard against bone loss or fractures, the following is recommended:  If  you are younger than age 50, take 1000 mg of calcium and at least 600 mg of Vitamin D per day.  If you are older than age 50 but younger than age 70, take 1200 mg of calcium and at least 600 mg of Vitamin D per day.  If you are older than age 70, take 1200 mg of calcium and at least 800 mg of Vitamin D per day. Smoking and excessive alcohol intake increases the risk of osteoporosis. Eat foods rich in calcium and vitamin D and do weight bearing exercises several times a week as your caregiver suggests. DIABETES Diabetes Mellitus: If you have type I or type 2 diabetes, you should keep your blood sugar under control with diet, exercise, and recommended medication. Avoid starchy and fatty foods, and too many sweets. Being overweight can make diabetes control more difficult. COGNITION AND MEMORY Cognition and Memory: Menopausal hormone therapy is not recommended for the prevention of cognitive disorders such as Alzheimer's disease or memory loss.  DEPRESSION  Depression may occur at any age, but it is common in elderly women. This may be because of physical, medical, social (loneliness), or financial problems and needs. If you are experiencing depression because of medical problems and control of symptoms, talk to your caregiver about this. Physical   activity and exercise may help with mood and sleep. Community and volunteer involvement may improve your sense of value and worth. If you have depression and you feel that the problem is getting worse or becoming severe, talk to your caregiver about which treatment options are best for you. ACCIDENTS  Accidents are common and can be serious in elderly woman. Prepare your house to prevent accidents. Eliminate throw rugs, place hand bars in bath, shower, and toilet areas. Avoid wearing high heeled shoes or walking on wet, snowy, and icy areas. Limit or stop driving if you have vision or hearing problems, or if you feel you are unsteady with your movements and  reflexes. HEPATITIS C Hepatitis C is a type of viral infection affecting the liver. It is spread mainly through contact with blood from an infected person. It can be treated, but if left untreated, it can lead to severe liver damage over the years. Many people who are infected do not know that the virus is in their blood. If you are a "baby-boomer", it is recommended that you have one screening test for Hepatitis C. IMMUNIZATIONS  Several immunizations are important to consider having during your senior years, including:   Tetanus, diphtheria, and pertussis booster shot.  Influenza every year before the flu season begins.  Pneumonia vaccine.  Shingles vaccine.  Others, as indicated based on your specific needs. Talk to your caregiver about these. Document Released: 09/22/2005 Document Revised: 12/15/2013 Document Reviewed: 05/18/2008 Exit[employer] Patient Information 2015 [employer]. This information is not intended to replace advice given to you by your health care provider. Make sure you discuss any questions you have with your health care provider.  

## 2014-05-19 NOTE — Progress Notes (Signed)
Amber Choi 1965-10-14 [id number]    History:    Presents for annual exam.  Postmenopausal with no bleeding. 1997 ablation with amenorrhea after/ BTL. [place] 43 -  2014. Primary care manages hypertension and diabetes. Currently doing Weight Watchers and has had a 22 pound weight loss in the last few months. Normal Pap and mammogram history.  Past medical history, past surgical history, family history and social history were all reviewed and documented in the EPIC chart. 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 Amber Choi 26 working doing better,Amber Choi lives in [place] doing well. Parents hypertension.  ROS:  A  12 point ROS was performed and pertinent positives and negatives are included.  Exam:  Filed Vitals:   05/19/14 1624  BP: 130/84    General appearance:  Normal Thyroid:  Symmetrical, normal in size, without palpable masses or nodularity. Respiratory  Auscultation:  Clear without wheezing or rhonchi Cardiovascular  Auscultation:  Regular rate, without rubs, murmurs or gallops  Edema/varicosities:  Not grossly evident Abdominal  Soft,nontender, without masses, guarding or rebound.  Liver/spleen:  No organomegaly noted  Hernia:  None appreciated  Skin  Inspection:  Grossly normal   Breasts: Examined lying and sitting.     Right: Without masses, retractions, discharge or axillary adenopathy.     Left: Without masses, retractions, discharge or axillary adenopathy. Gentitourinary   Inguinal/mons:  Normal without inguinal adenopathy  External genitalia:  Normal  BUS/Urethra/Skene's glands:  Normal  Vagina:  Normal  Cervix:  Normal  Uterus:  normal in size, shape and contour.  Midline and mobile  Adnexa/parametria:     Rt: Without masses or tenderness.   Lt: Without masses or tenderness.  Anus and perineum: Normal  Digital rectal exam: Normal sphincter tone without palpated masses or tenderness  Assessment/Plan:  48 y.o. MWF G2P2 for annual exam with no  complaints.  Postmenopausal/ablation/amenorrhea on no HRT Obesity Hypertension/hypothyroidism/diabetes primary care manages labs and meds  Plan: SBE's, continue annual mammogram, 3-D tomography reviewed and encouraged history of dense breast. Continue regular exercise and decrease calories for continued weight loss, calcium rich diet, vitamin D 2000 daily. Instructed to have vitamin D level checked at next lab draw. Paps normal 2014, new screening guidelines reviewed.   [place], 5:15 PM 05/19/2014

## 2014-06-15 ENCOUNTER — Encounter: Payer: Self-pay | Admitting: Women's Health

## 2015-02-08 ENCOUNTER — Other Ambulatory Visit: Payer: Self-pay

## 2015-05-05 ENCOUNTER — Other Ambulatory Visit: Payer: Self-pay

## 2015-05-05 DIAGNOSIS — Z1231 Encounter for screening mammogram for malignant neoplasm of breast: Secondary | ICD-10-CM

## 2015-05-21 ENCOUNTER — Ambulatory Visit
Admission: RE | Admit: 2015-05-21 | Discharge: 2015-05-21 | Disposition: A | Discharge status: Home / Self Care | Payer: 59 | Source: Ambulatory Visit

## 2015-05-21 DIAGNOSIS — Z1231 Encounter for screening mammogram for malignant neoplasm of breast: Secondary | ICD-10-CM

## 2016-04-27 ENCOUNTER — Encounter: Payer: Self-pay | Admitting: Women's Health

## 2016-05-31 ENCOUNTER — Encounter: Payer: Self-pay | Admitting: Women's Health

## 2016-05-31 ENCOUNTER — Other Ambulatory Visit: Payer: Self-pay | Admitting: Women's Health

## 2016-05-31 ENCOUNTER — Ambulatory Visit (INDEPENDENT_AMBULATORY_CARE_PROVIDER_SITE_OTHER): Payer: 59 | Admitting: Women's Health

## 2016-05-31 VITALS — BP 128/78 | Ht 64.0 in | Wt 250.0 lb

## 2016-05-31 DIAGNOSIS — Z1231 Encounter for screening mammogram for malignant neoplasm of breast: Secondary | ICD-10-CM

## 2016-05-31 DIAGNOSIS — Z01419 Encounter for gynecological examination (general) (routine) without abnormal findings: Secondary | ICD-10-CM | POA: Diagnosis not present

## 2016-05-31 NOTE — Progress Notes (Signed)
MARQUITA LEVITT November 24, 1965 [id number]    History:    Presents for annual exam. 1997 ablation amenorrhea following, BTL. 2014 [place] 43. Normal Pap and mammogram history. 12/2015 negative screening colonoscopy at [facility]. Hypertension/hyperthyroidism/diabetes managed by primary care, last hemoglobin A1c was 6. Currently exercising 5 days weekly continues to struggle with her weight.   Past medical history, past surgical history, family history and social history were all reviewed and documented in the EPIC chart. 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 Daughter lives in [place], son 29 both doing well.   ROS:  A ROS was performed and pertinent positives and negatives are included.  Exam:  Vitals:   05/31/16 0804  BP: 128/78  Weight: 250 lb (113.4 kg)  Height: 5\' 4"  (1.626 m)   Body mass index is 42.91 kg/m.   General appearance:  Normal Thyroid:  Symmetrical, normal in size, without palpable masses or nodularity. Respiratory  Auscultation:  Clear without wheezing or rhonchi Cardiovascular  Auscultation:  Regular rate, without rubs, murmurs or gallops  Edema/varicosities:  Not grossly evident Abdominal  Soft,nontender, without masses, guarding or rebound.  Liver/spleen:  No organomegaly noted  Hernia:  None appreciated  Skin  Inspection:  Grossly normal   Breasts: Examined lying and sitting.     Right: Without masses, retractions, discharge or axillary adenopathy.     Left: Without masses, retractions, discharge or axillary adenopathy. Gentitourinary   Inguinal/mons:  Normal without inguinal adenopathy  External genitalia:  Normal  BUS/Urethra/Skene's glands:  Normal  Vagina:  Normal  Cervix:  Normal  Uterus:  normal in size, shape and contour.  Midline and mobile  Adnexa/parametria:     Rt: Without masses or tenderness.   Lt: Without masses or tenderness.  Anus and perineum: Normal  Digital rectal exam: Normal sphincter tone without palpated masses or  tenderness  Assessment/Plan:  50 y.o. M WF G2 P2 for annual exam with no complaints.  1997 ablation/amenorrhea/postmenopausal on no HRT Hypertension/hyperthyroidism/diabetes-primary care manages labs and meds Obesity  Plan: SBE's, continue annual 3-D screening mammogram history of dense breasts. Continue regular  Exercise, ostium rich diet, vitamin D 1000 daily, continue to decrease calories/carbs encouraged. UA, Pap with HR HPV typing, new screening guidelines reviewed.    [name] [employer], 8:28 AM 05/31/2016

## 2016-05-31 NOTE — Addendum Note (Signed)
Addended by: Burnett Kanaris on: 05/31/2016 08:40 AM   Modules accepted: Orders

## 2016-05-31 NOTE — Patient Instructions (Addendum)
Menopause is a normal process in which your reproductive ability comes to an end. This process happens gradually over a span of months to years, usually between the ages of 48 and 55. Menopause is complete when you have missed 12 consecutive menstrual periods. It is important to talk with your health care provider about some of the most common conditions that affect postmenopausal women, such as heart disease, cancer, and bone loss (osteoporosis). Adopting a healthy lifestyle and getting preventive care can help to promote your health and wellness. Those actions can also lower your chances of developing some of these common conditions. WHAT SHOULD I KNOW ABOUT MENOPAUSE? During menopause, you may experience a number of symptoms, such as:  Moderate-to-severe hot flashes.  Night sweats.  Decrease in sex drive.  Mood swings.  Headaches.  Tiredness.  Irritability.  Memory problems.  Insomnia. Choosing to treat or not to treat menopausal changes is an individual decision that you make with your health care provider. WHAT SHOULD I KNOW ABOUT HORMONE REPLACEMENT THERAPY AND SUPPLEMENTS? Hormone therapy products are effective for treating symptoms that are associated with menopause, such as hot flashes and night sweats. Hormone replacement carries certain risks, especially as you become older. If you are thinking about using estrogen or estrogen with progestin treatments, discuss the benefits and risks with your health care provider. WHAT SHOULD I KNOW ABOUT HEART DISEASE AND STROKE? Heart disease, heart attack, and stroke become more likely as you age. This may be due, in part, to the hormonal changes that your body experiences during menopause. These can affect how your body processes dietary fats, triglycerides, and cholesterol. Heart attack and stroke are both medical emergencies. There are many things that you can do to help prevent heart disease and stroke:  Have your blood pressure  checked at least every 1-2 years. High blood pressure causes heart disease and increases the risk of stroke.  If you are 55-79 years old, ask your health care provider if you should take aspirin to prevent a heart attack or a stroke.  Do not use any tobacco products, including cigarettes, chewing tobacco, or electronic cigarettes. If you need help quitting, ask your health care provider.  It is important to eat a healthy diet and maintain a healthy weight.  Be sure to include plenty of vegetables, fruits, low-fat dairy products, and lean protein.  Avoid eating foods that are high in solid fats, added sugars, or salt (sodium).  Get regular exercise. This is one of the most important things that you can do for your health.  Try to exercise for at least 150 minutes each week. The type of exercise that you do should increase your heart rate and make you sweat. This is known as moderate-intensity exercise.  Try to do strengthening exercises at least twice each week. Do these in addition to the moderate-intensity exercise.  Know your numbers.Ask your health care provider to check your cholesterol and your blood glucose. Continue to have your blood tested as directed by your health care provider. WHAT SHOULD I KNOW ABOUT CANCER SCREENING? There are several types of cancer. Take the following steps to reduce your risk and to catch any cancer development as early as possible. Breast Cancer  Practice breast self-awareness.  This means understanding how your breasts normally appear and feel.  It also means doing regular breast self-exams. Let your health care provider know about any changes, no matter how small.  If you are 40 or older, have a clinician do a   breast exam (clinical breast exam or CBE) every year. Depending on your age, family history, and medical history, it may be recommended that you also have a yearly breast X-ray (mammogram).  If you have a family history of breast cancer,  talk with your health care provider about genetic screening.  If you are at high risk for breast cancer, talk with your health care provider about having an MRI and a mammogram every year.  Breast cancer (BRCA) gene test is recommended for women who have family members with BRCA-related cancers. Results of the assessment will determine the need for genetic counseling and BRCA1 and for BRCA2 testing. BRCA-related cancers include these types:  Breast. This occurs in males or females.  Ovarian.  Tubal. This may also be called fallopian tube cancer.  Cancer of the abdominal or pelvic lining (peritoneal cancer).  Prostate.  Pancreatic. Cervical, Uterine, and Ovarian Cancer Your health care provider may recommend that you be screened regularly for cancer of the pelvic organs. These include your ovaries, uterus, and vagina. This screening involves a pelvic exam, which includes checking for microscopic changes to the surface of your cervix (Pap test).  For women ages 21-65, health care providers may recommend a pelvic exam and a Pap test every three years. For women ages 77-65, they may recommend the Pap test and pelvic exam, combined with testing for human papilloma virus (HPV), every five years. Some types of HPV increase your risk of cervical cancer. Testing for HPV may also be done on women of any age who have unclear Pap test results.  Other health care providers may not recommend any screening for nonpregnant women who are considered low risk for pelvic cancer and have no symptoms. Ask your health care provider if a screening pelvic exam is right for you.  If you have had past treatment for cervical cancer or a condition that could lead to cancer, you need Pap tests and screening for cancer for at least 20 years after your treatment. If Pap tests have been discontinued for you, your risk factors (such as having a new sexual partner) need to be reassessed to determine if you should start having  screenings again. Some women have medical problems that increase the chance of getting cervical cancer. In these cases, your health care provider may recommend that you have screening and Pap tests more often.  If you have a family history of uterine cancer or ovarian cancer, talk with your health care provider about genetic screening.  If you have vaginal bleeding after reaching menopause, tell your health care provider.  There are currently no reliable tests available to screen for ovarian cancer. Lung Cancer Lung cancer screening is recommended for adults 3-70 years old who are at high risk for lung cancer because of a history of smoking. A yearly low-dose CT scan of the lungs is recommended if you:  Currently smoke.  Have a history of at least 30 pack-years of smoking and you currently smoke or have quit within the past 15 years. A pack-year is smoking an average of one pack of cigarettes per day for one year. Yearly screening should:  Continue until it has been 15 years since you quit.  Stop if you develop a health problem that would prevent you from having lung cancer treatment. Colorectal Cancer  This type of cancer can be detected and can often be prevented.  Routine colorectal cancer screening usually begins at age 38 and continues through age 12.  If you have  risk factors for colon cancer, your health care provider may recommend that you be screened at an earlier age.  If you have a family history of colorectal cancer, talk with your health care provider about genetic screening.  Your health care provider may also recommend using home test kits to check for hidden blood in your stool.  A small camera at the end of a tube can be used to examine your colon directly (sigmoidoscopy or colonoscopy). This is done to check for the earliest forms of colorectal cancer.  Direct examination of the colon should be repeated every 5-10 years until age 67. However, if early forms of  precancerous polyps or small growths are found or if you have a family history or genetic risk for colorectal cancer, you may need to be screened more often. Skin Cancer  Check your skin from head to toe regularly.  Monitor any moles. Be sure to tell your health care provider:  About any new moles or changes in moles, especially if there is a change in a mole's shape or color.  If you have a mole that is larger than the size of a pencil eraser.  If any of your family members has a history of skin cancer, especially at a Amber Choi age, talk with your health care provider about genetic screening.  Always use sunscreen. Apply sunscreen liberally and repeatedly throughout the day.  Whenever you are outside, protect yourself by wearing long sleeves, pants, a wide-brimmed hat, and sunglasses. WHAT SHOULD I KNOW ABOUT OSTEOPOROSIS? Osteoporosis is a condition in which bone destruction happens more quickly than new bone creation. After menopause, you may be at an increased risk for osteoporosis. To help prevent osteoporosis or the bone fractures that can happen because of osteoporosis, the following is recommended:  If you are 39-61 years old, get at least 1,000 mg of calcium and at least 600 mg of vitamin D per day.  If you are older than age 16 but younger than age 7, get at least 1,200 mg of calcium and at least 600 mg of vitamin D per day.  If you are older than age 47, get at least 1,200 mg of calcium and at least 800 mg of vitamin D per day. Smoking and excessive alcohol intake increase the risk of osteoporosis. Eat foods that are rich in calcium and vitamin D, and do weight-bearing exercises several times each week as directed by your health care provider. WHAT SHOULD I KNOW ABOUT HOW MENOPAUSE AFFECTS Amber Choi? Depression may occur at any age, but it is more common as you become older. Common symptoms of depression include:  Low or sad mood.  Changes in sleep patterns.  Changes  in appetite or eating patterns.  Feeling an overall lack of motivation or enjoyment of activities that you previously enjoyed.  Frequent crying spells. Talk with your health care provider if you think that you are experiencing depression. WHAT SHOULD I KNOW ABOUT IMMUNIZATIONS? It is important that you get and maintain your immunizations. These include:  Tetanus, diphtheria, and pertussis (Tdap) booster vaccine.  Influenza every year before the flu season begins.  Pneumonia vaccine.  Shingles vaccine. Your health care provider may also recommend other immunizations.   This information is not intended to replace advice given to you by your health care provider. Make sure you discuss any questions you have with your health care provider.   Document Released: 09/22/2005 Document Revised: 08/21/2014 Document Reviewed: 04/02/2014 Elsevier Interactive Patient Education 2016 [employer] Basic Carbohydrate Counting for Diabetes Mellitus Carbohydrate counting is a method for keeping track of the amount of carbohydrates you eat. Eating carbohydrates naturally increases the level of sugar (glucose) in your blood, so it is important for you to know the amount that is okay for you to have in every meal. Carbohydrate counting helps keep the level of glucose in your blood within normal limits. The amount of carbohydrates allowed is different for every person. A dietitian can help you calculate the amount that is right for you. Once you know the amount of carbohydrates you can have, you can count the carbohydrates in the foods you want to eat. Carbohydrates are found in the following foods:  Grains, such as breads and cereals.  Dried beans and soy products.  Starchy vegetables, such as potatoes, peas, and corn.  Fruit and fruit juices.  Milk and yogurt.  Sweets and snack foods, such as cake, cookies, candy, chips, soft drinks, and fruit drinks. CARBOHYDRATE COUNTING There are two ways to  count the carbohydrates in your food. You can use either of the methods or a combination of both. Reading the "Nutrition Facts" on Packaged Food The "Nutrition Facts" is an area that is included on the labels of almost all packaged food and beverages in the [place]. It includes the serving size of that food or beverage and information about the nutrients in each serving of the food, including the grams (g) of carbohydrate per serving.  Decide the number of servings of this food or beverage that you will be able to eat or drink. Multiply that number of servings by the number of grams of carbohydrate that is listed on the label for that serving. The total will be the amount of carbohydrates you will be having when you eat or drink this food or beverage. Learning Standard Serving Sizes of Food When you eat food that is not packaged or does not include "Nutrition Facts" on the label, you need to measure the servings in order to count the amount of carbohydrates.A serving of most carbohydrate-rich foods contains about 15 g of carbohydrates. The following list includes serving sizes of carbohydrate-rich foods that provide 15 g ofcarbohydrate per serving:   1 slice of bread (1 oz) or 1 six-inch tortilla.    of a hamburger bun or English muffin.  4-6 crackers.   cup unsweetened dry cereal.    cup hot cereal.   cup rice or pasta.    cup mashed potatoes or  of a large baked potato.  1 cup fresh fruit or one small piece of fruit.    cup canned or frozen fruit or fruit juice.  1 cup milk.   cup plain fat-free yogurt or yogurt sweetened with artificial sweeteners.   cup cooked dried beans or starchy vegetable, such as peas, corn, or potatoes.  Decide the number of standard-size servings that you will eat. Multiply that number of servings by 15 (the grams of carbohydrates in that serving). For example, if you eat 2 cups of strawberries, you will have eaten 2 servings and 30 g of  carbohydrates (2 servings x 15 g = 30 g). For foods such as soups and casseroles, in which more than one food is mixed in, you will need to count the carbohydrates in each food that is included. EXAMPLE OF CARBOHYDRATE COUNTING Sample Dinner  3 oz chicken breast.   cup of brown rice.   cup of corn.  1 cup milk.   1 cup   strawberries with sugar-free whipped topping.  Carbohydrate Calculation Step 1: Identify the foods that contain carbohydrates:   Rice.   Corn.   Milk.   Strawberries. Step 2:Calculate the number of servings eaten of each:   2 servings of rice.   1 serving of corn.   1 serving of milk.   1 serving of strawberries. Step 3: Multiply each of those number of servings by 15 g:   2 servings of rice x 15 g = 30 g.   1 serving of corn x 15 g = 15 g.   1 serving of milk x 15 g = 15 g.   1 serving of strawberries x 15 g = 15 g. Step 4: Add together all of the amounts to find the total grams of carbohydrates eaten: 30 g + 15 g + 15 g + 15 g = 75 g.   This information is not intended to replace advice given to you by your health care provider. Make sure you discuss any questions you have with your health care provider.   Document Released: 07/31/2005 Document Revised: 08/21/2014 Document Reviewed: 06/27/2013 Elsevier Interactive Patient Education [employer].

## 2016-06-01 LAB — URINALYSIS W MICROSCOPIC + REFLEX CULTURE
BACTERIA UA: NONE SEEN [HPF]
BILIRUBIN URINE: NEGATIVE
Casts: NONE SEEN [LPF]
Glucose, UA: NEGATIVE
Hgb urine dipstick: NEGATIVE
KETONES UR: NEGATIVE
Leukocytes, UA: NEGATIVE
Nitrite: NEGATIVE
PROTEIN: NEGATIVE
Specific Gravity, Urine: 1.027 (ref 1.001–1.035)
Yeast: NONE SEEN [HPF]
pH: 5.5 (ref 5.0–8.0)

## 2016-06-02 LAB — PAP, TP IMAGING W/ HPV RNA, RFLX HPV TYPE 16,18/45: HPV MRNA, HIGH RISK: NOT DETECTED

## 2016-06-02 LAB — URINE CULTURE: Organism ID, Bacteria: NO GROWTH

## 2016-06-16 ENCOUNTER — Ambulatory Visit: Payer: 59

## 2016-07-18 ENCOUNTER — Encounter: Payer: Self-pay | Admitting: Women's Health

## 2016-07-18 ENCOUNTER — Ambulatory Visit
Admission: RE | Admit: 2016-07-18 | Discharge: 2016-07-18 | Disposition: A | Discharge status: Home / Self Care | Payer: 59 | Source: Ambulatory Visit | Attending: Women's Health | Admitting: Women's Health

## 2016-07-18 DIAGNOSIS — Z1231 Encounter for screening mammogram for malignant neoplasm of breast: Secondary | ICD-10-CM

## 2016-08-22 DIAGNOSIS — E7800 Pure hypercholesterolemia, unspecified: Secondary | ICD-10-CM | POA: Diagnosis not present

## 2016-08-23 DIAGNOSIS — E119 Type 2 diabetes mellitus without complications: Secondary | ICD-10-CM | POA: Diagnosis not present

## 2016-08-23 DIAGNOSIS — D3132 Benign neoplasm of left choroid: Secondary | ICD-10-CM | POA: Diagnosis not present

## 2016-09-26 DIAGNOSIS — E119 Type 2 diabetes mellitus without complications: Secondary | ICD-10-CM | POA: Diagnosis not present

## 2016-09-26 DIAGNOSIS — E039 Hypothyroidism, unspecified: Secondary | ICD-10-CM | POA: Diagnosis not present

## 2016-09-26 DIAGNOSIS — E041 Nontoxic single thyroid nodule: Secondary | ICD-10-CM | POA: Diagnosis not present

## 2016-12-27 ENCOUNTER — Encounter: Payer: Self-pay | Admitting: Gynecology

## 2016-12-27 DIAGNOSIS — E039 Hypothyroidism, unspecified: Secondary | ICD-10-CM | POA: Diagnosis not present

## 2016-12-27 DIAGNOSIS — E041 Nontoxic single thyroid nodule: Secondary | ICD-10-CM | POA: Diagnosis not present

## 2016-12-27 DIAGNOSIS — E1165 Type 2 diabetes mellitus with hyperglycemia: Secondary | ICD-10-CM | POA: Diagnosis not present

## 2017-01-19 DIAGNOSIS — E1165 Type 2 diabetes mellitus with hyperglycemia: Secondary | ICD-10-CM | POA: Diagnosis not present

## 2017-02-02 DIAGNOSIS — M25572 Pain in left ankle and joints of left foot: Secondary | ICD-10-CM | POA: Diagnosis not present

## 2017-02-02 DIAGNOSIS — M25571 Pain in right ankle and joints of right foot: Secondary | ICD-10-CM | POA: Diagnosis not present

## 2017-02-09 DIAGNOSIS — E7800 Pure hypercholesterolemia, unspecified: Secondary | ICD-10-CM | POA: Diagnosis not present

## 2017-02-09 DIAGNOSIS — Z23 Encounter for immunization: Secondary | ICD-10-CM | POA: Diagnosis not present

## 2017-02-09 DIAGNOSIS — I10 Essential (primary) hypertension: Secondary | ICD-10-CM | POA: Diagnosis not present

## 2017-02-09 DIAGNOSIS — K219 Gastro-esophageal reflux disease without esophagitis: Secondary | ICD-10-CM | POA: Diagnosis not present

## 2017-02-16 DIAGNOSIS — S86011D Strain of right Achilles tendon, subsequent encounter: Secondary | ICD-10-CM | POA: Diagnosis not present

## 2017-02-16 DIAGNOSIS — S86012D Strain of left Achilles tendon, subsequent encounter: Secondary | ICD-10-CM | POA: Diagnosis not present

## 2017-02-16 DIAGNOSIS — R262 Difficulty in walking, not elsewhere classified: Secondary | ICD-10-CM | POA: Diagnosis not present

## 2017-02-21 DIAGNOSIS — S86012D Strain of left Achilles tendon, subsequent encounter: Secondary | ICD-10-CM | POA: Diagnosis not present

## 2017-02-21 DIAGNOSIS — R262 Difficulty in walking, not elsewhere classified: Secondary | ICD-10-CM | POA: Diagnosis not present

## 2017-02-21 DIAGNOSIS — R2689 Other abnormalities of gait and mobility: Secondary | ICD-10-CM | POA: Diagnosis not present

## 2017-02-23 DIAGNOSIS — S86012D Strain of left Achilles tendon, subsequent encounter: Secondary | ICD-10-CM | POA: Diagnosis not present

## 2017-02-23 DIAGNOSIS — R262 Difficulty in walking, not elsewhere classified: Secondary | ICD-10-CM | POA: Diagnosis not present

## 2017-02-23 DIAGNOSIS — S86011D Strain of right Achilles tendon, subsequent encounter: Secondary | ICD-10-CM | POA: Diagnosis not present

## 2017-02-28 DIAGNOSIS — S86011D Strain of right Achilles tendon, subsequent encounter: Secondary | ICD-10-CM | POA: Diagnosis not present

## 2017-02-28 DIAGNOSIS — S86012D Strain of left Achilles tendon, subsequent encounter: Secondary | ICD-10-CM | POA: Diagnosis not present

## 2017-02-28 DIAGNOSIS — R262 Difficulty in walking, not elsewhere classified: Secondary | ICD-10-CM | POA: Diagnosis not present

## 2017-03-02 DIAGNOSIS — R262 Difficulty in walking, not elsewhere classified: Secondary | ICD-10-CM | POA: Diagnosis not present

## 2017-03-02 DIAGNOSIS — S86012D Strain of left Achilles tendon, subsequent encounter: Secondary | ICD-10-CM | POA: Diagnosis not present

## 2017-03-02 DIAGNOSIS — S86011D Strain of right Achilles tendon, subsequent encounter: Secondary | ICD-10-CM | POA: Diagnosis not present

## 2017-03-08 DIAGNOSIS — S86012D Strain of left Achilles tendon, subsequent encounter: Secondary | ICD-10-CM | POA: Diagnosis not present

## 2017-03-08 DIAGNOSIS — R262 Difficulty in walking, not elsewhere classified: Secondary | ICD-10-CM | POA: Diagnosis not present

## 2017-03-08 DIAGNOSIS — S86011D Strain of right Achilles tendon, subsequent encounter: Secondary | ICD-10-CM | POA: Diagnosis not present

## 2017-03-14 DIAGNOSIS — S86012D Strain of left Achilles tendon, subsequent encounter: Secondary | ICD-10-CM | POA: Diagnosis not present

## 2017-03-14 DIAGNOSIS — S86011D Strain of right Achilles tendon, subsequent encounter: Secondary | ICD-10-CM | POA: Diagnosis not present

## 2017-04-10 ENCOUNTER — Other Ambulatory Visit: Payer: Self-pay | Admitting: Internal Medicine

## 2017-04-10 DIAGNOSIS — E039 Hypothyroidism, unspecified: Secondary | ICD-10-CM | POA: Diagnosis not present

## 2017-04-10 DIAGNOSIS — E041 Nontoxic single thyroid nodule: Secondary | ICD-10-CM | POA: Diagnosis not present

## 2017-04-10 DIAGNOSIS — E785 Hyperlipidemia, unspecified: Secondary | ICD-10-CM | POA: Diagnosis not present

## 2017-04-20 ENCOUNTER — Other Ambulatory Visit: Payer: 59

## 2017-05-01 ENCOUNTER — Ambulatory Visit
Admission: RE | Admit: 2017-05-01 | Discharge: 2017-05-01 | Disposition: A | Discharge status: Home / Self Care | Payer: 59 | Source: Ambulatory Visit | Attending: Internal Medicine | Admitting: Internal Medicine

## 2017-05-01 DIAGNOSIS — E041 Nontoxic single thyroid nodule: Secondary | ICD-10-CM

## 2017-06-01 ENCOUNTER — Encounter: Payer: 59 | Admitting: Women's Health

## 2017-06-06 ENCOUNTER — Ambulatory Visit (INDEPENDENT_AMBULATORY_CARE_PROVIDER_SITE_OTHER): Payer: 59 | Admitting: Women's Health

## 2017-06-06 ENCOUNTER — Encounter: Payer: Self-pay | Admitting: Women's Health

## 2017-06-06 VITALS — BP 120/78 | Ht 64.0 in | Wt 233.0 lb

## 2017-06-06 DIAGNOSIS — Z01419 Encounter for gynecological examination (general) (routine) without abnormal findings: Secondary | ICD-10-CM | POA: Diagnosis not present

## 2017-06-06 NOTE — Progress Notes (Signed)
Amber Choi [birth year] [id number]    History:    Presents for annual exam.  Postmenopausal on no HRT with no bleeding. 1997 ablation. Normal Pap and mammogram history. History of bicornate uterus. Hypertension, diabetes, hypothyroidism, GERD managed by primary care. 2017 negative colonoscopy. In the process of weight loss is down 30 pounds.  Past medical history, past surgical history, family history and social history were all reviewed and documented in the EPIC chart. Publishing copy at [facility]. Daughter lives in [place], son local.  ROS:  A ROS was performed and pertinent positives and negatives are included.  Exam:  Vitals:   06/06/17 1559  BP: 120/78  Weight: 233 lb (105.7 kg)  Height: 5\' 4"  (1.626 m)   Body mass index is 39.99 kg/m.   General appearance:  Normal Thyroid:  Symmetrical, normal in size, without palpable masses or nodularity. Respiratory  Auscultation:  Clear without wheezing or rhonchi Cardiovascular  Auscultation:  Regular rate, without rubs, murmurs or gallops  Edema/varicosities:  Not grossly evident Abdominal  Soft,nontender, without masses, guarding or rebound.  Liver/spleen:  No organomegaly noted  Hernia:  None appreciated  Skin  Inspection:  Grossly normal   Breasts: Examined lying and sitting.     Right: Without masses, retractions, discharge or axillary adenopathy.     Left: Without masses, retractions, discharge or axillary adenopathy. Gentitourinary   Inguinal/mons:  Normal without inguinal adenopathy  External genitalia:  Normal  BUS/Urethra/Skene's glands:  Normal  Vagina:  Normal  Cervix:  Normal  Uterus:   normal in size, shape and contour.  Midline and mobile  Adnexa/parametria:     Rt: Without masses or tenderness.   Lt: Without masses or tenderness.  Anus and perineum: Normal  Digital rectal exam: Normal sphincter tone without palpated masses or tenderness  Assessment/Plan:  51 y.o. M WF G2 P2  for annual exam with no  complaints.  Postmenopausal/no HRT/no bleeding Bicornate uterus Hypertension, diabetes, GERD, hypothyroidism-primary care manages labs and meds Obesity  Plan: SBE's, continue annual screening mammogram, calcium rich diet, vitamin D 2000 daily encouraged. Congratulated on weight loss and encouraged to continue healthy diet with exercise. DEXA will have done at her office and fax results,  works at [employer]. Pap normal 2017, new screening guidelines reviewed.     [place], 4:19 PM 06/06/2017

## 2017-06-06 NOTE — Patient Instructions (Signed)
Health Maintenance for Postmenopausal Women Menopause is a normal process in which your reproductive ability comes to an end. This process happens gradually over a span of months to years, usually between the ages of 83 and 41. Menopause is complete when you have missed 12 consecutive menstrual periods. It is important to talk with your health care provider about some of the most common conditions that affect postmenopausal women, such as heart disease, cancer, and bone loss (osteoporosis). Adopting a healthy lifestyle and getting preventive care can help to promote your health and wellness. Those actions can also lower your chances of developing some of these common conditions. What should I know about menopause? During menopause, you may experience a number of symptoms, such as:  Moderate-to-severe hot flashes.  Night sweats.  Decrease in sex drive.  Mood swings.  Headaches.  Tiredness.  Irritability.  Memory problems.  Insomnia.  Choosing to treat or not to treat menopausal changes is an individual decision that you make with your health care provider. What should I know about hormone replacement therapy and supplements? Hormone therapy products are effective for treating symptoms that are associated with menopause, such as hot flashes and night sweats. Hormone replacement carries certain risks, especially as you become older. If you are thinking about using estrogen or estrogen with progestin treatments, discuss the benefits and risks with your health care provider. What should I know about heart disease and stroke? Heart disease, heart attack, and stroke become more likely as you age. This may be due, in part, to the hormonal changes that your body experiences during menopause. These can affect how your body processes dietary fats, triglycerides, and cholesterol. Heart attack and stroke are both medical emergencies. There are many things that you can do to help prevent heart  disease and stroke:  Have your blood pressure checked at least every 1-2 years. High blood pressure causes heart disease and increases the risk of stroke.  If you are 12-82 years old, ask your health care provider if you should take aspirin to prevent a heart attack or a stroke.  Do not use any tobacco products, including cigarettes, chewing tobacco, or electronic cigarettes. If you need help quitting, ask your health care provider.  It is important to eat a healthy diet and maintain a healthy weight. ? Be sure to include plenty of vegetables, fruits, low-fat dairy products, and lean protein. ? Avoid eating foods that are high in solid fats, added sugars, or salt (sodium).  Get regular exercise. This is one of the most important things that you can do for your health. ? Try to exercise for at least 150 minutes each week. The type of exercise that you do should increase your heart rate and make you sweat. This is known as moderate-intensity exercise. ? Try to do strengthening exercises at least twice each week. Do these in addition to the moderate-intensity exercise.  Know your numbers.Ask your health care provider to check your cholesterol and your blood glucose. Continue to have your blood tested as directed by your health care provider.  What should I know about cancer screening? There are several types of cancer. Take the following steps to reduce your risk and to catch any cancer development as early as possible. Breast Cancer  Practice breast self-awareness. ? This means understanding how your breasts normally appear and feel. ? It also means doing regular breast self-exams. Let your health care provider know about any changes, no matter how small.  If you are 40  or older, have a clinician do a breast exam (clinical breast exam or CBE) every year. Depending on your age, family history, and medical history, it may be recommended that you also have a yearly breast X-ray  (mammogram).  If you have a family history of breast cancer, talk with your health care provider about genetic screening.  If you are at high risk for breast cancer, talk with your health care provider about having an MRI and a mammogram every year.  Breast cancer (BRCA) gene test is recommended for women who have family members with BRCA-related cancers. Results of the assessment will determine the need for genetic counseling and BRCA1 and for BRCA2 testing. BRCA-related cancers include these types: ? Breast. This occurs in males or females. ? Ovarian. ? Tubal. This may also be called fallopian tube cancer. ? Cancer of the abdominal or pelvic lining (peritoneal cancer). ? Prostate. ? Pancreatic.  Cervical, Uterine, and Ovarian Cancer Your health care provider may recommend that you be screened regularly for cancer of the pelvic organs. These include your ovaries, uterus, and vagina. This screening involves a pelvic exam, which includes checking for microscopic changes to the surface of your cervix (Pap test).  For women ages 21-65, health care providers may recommend a pelvic exam and a Pap test every three years. For women ages 72-65, they may recommend the Pap test and pelvic exam, combined with testing for human papilloma virus (HPV), every five years. Some types of HPV increase your risk of cervical cancer. Testing for HPV may also be done on women of any age who have unclear Pap test results.  Other health care providers may not recommend any screening for nonpregnant women who are considered low risk for pelvic cancer and have no symptoms. Ask your health care provider if a screening pelvic exam is right for you.  If you have had past treatment for cervical cancer or a condition that could lead to cancer, you need Pap tests and screening for cancer for at least 20 years after your treatment. If Pap tests have been discontinued for you, your risk factors (such as having a new sexual  partner) need to be reassessed to determine if you should start having screenings again. Some women have medical problems that increase the chance of getting cervical cancer. In these cases, your health care provider may recommend that you have screening and Pap tests more often.  If you have a family history of uterine cancer or ovarian cancer, talk with your health care provider about genetic screening.  If you have vaginal bleeding after reaching menopause, tell your health care provider.  There are currently no reliable tests available to screen for ovarian cancer.  Lung Cancer Lung cancer screening is recommended for adults 65-82 years old who are at high risk for lung cancer because of a history of smoking. A yearly low-dose CT scan of the lungs is recommended if you:  Currently smoke.  Have a history of at least 30 pack-years of smoking and you currently smoke or have quit within the past 15 years. A pack-year is smoking an average of one pack of cigarettes per day for one year.  Yearly screening should:  Continue until it has been 15 years since you quit.  Stop if you develop a health problem that would prevent you from having lung cancer treatment.  Colorectal Cancer  This type of cancer can be detected and can often be prevented.  Routine colorectal cancer screening usually begins at  age 30 and continues through age 22.  If you have risk factors for colon cancer, your health care provider may recommend that you be screened at an earlier age.  If you have a family history of colorectal cancer, talk with your health care provider about genetic screening.  Your health care provider may also recommend using home test kits to check for hidden blood in your stool.  A small camera at the end of a tube can be used to examine your colon directly (sigmoidoscopy or colonoscopy). This is done to check for the earliest forms of colorectal cancer.  Direct examination of the colon  should be repeated every 5-10 years until age 28. However, if early forms of precancerous polyps or small growths are found or if you have a family history or genetic risk for colorectal cancer, you may need to be screened more often.  Skin Cancer  Check your skin from head to toe regularly.  Monitor any moles. Be sure to tell your health care provider: ? About any new moles or changes in moles, especially if there is a change in a mole's shape or color. ? If you have a mole that is larger than the size of a pencil eraser.  If any of your family members has a history of skin cancer, especially at a young age, talk with your health care provider about genetic screening.  Always use sunscreen. Apply sunscreen liberally and repeatedly throughout the day.  Whenever you are outside, protect yourself by wearing long sleeves, pants, a wide-brimmed hat, and sunglasses.  What should I know about osteoporosis? Osteoporosis is a condition in which bone destruction happens more quickly than new bone creation. After menopause, you may be at an increased risk for osteoporosis. To help prevent osteoporosis or the bone fractures that can happen because of osteoporosis, the following is recommended:  If you are 62-69 years old, get at least 1,000 mg of calcium and at least 600 mg of vitamin D per day.  If you are older than age 60 but younger than age 68, get at least 1,200 mg of calcium and at least 600 mg of vitamin D per day.  If you are older than age 35, get at least 1,200 mg of calcium and at least 800 mg of vitamin D per day.  Smoking and excessive alcohol intake increase the risk of osteoporosis. Eat foods that are rich in calcium and vitamin D, and do weight-bearing exercises several times each week as directed by your health care provider. What should I know about how menopause affects my mental health? Depression may occur at any age, but it is more common as you become older. Common symptoms of  depression include:  Low or sad mood.  Changes in sleep patterns.  Changes in appetite or eating patterns.  Feeling an overall lack of motivation or enjoyment of activities that you previously enjoyed.  Frequent crying spells.  Talk with your health care provider if you think that you are experiencing depression. What should I know about immunizations? It is important that you get and maintain your immunizations. These include:  Tetanus, diphtheria, and pertussis (Tdap) booster vaccine.  Influenza every year before the flu season begins.  Pneumonia vaccine.  Shingles vaccine.  Your health care provider may also recommend other immunizations. This information is not intended to replace advice given to you by your health care provider. Make sure you discuss any questions you have with your health care provider. Document Released: 09/22/2005  Document Revised: 02/18/2016 Document Reviewed: 05/04/2015 Elsevier Interactive Patient Education  2018 [employer]  

## 2017-07-18 DIAGNOSIS — E039 Hypothyroidism, unspecified: Secondary | ICD-10-CM | POA: Diagnosis not present

## 2017-07-18 DIAGNOSIS — E785 Hyperlipidemia, unspecified: Secondary | ICD-10-CM | POA: Diagnosis not present

## 2017-07-18 DIAGNOSIS — E041 Nontoxic single thyroid nodule: Secondary | ICD-10-CM | POA: Diagnosis not present

## 2017-09-13 ENCOUNTER — Other Ambulatory Visit: Payer: Self-pay | Admitting: Women's Health

## 2017-09-13 DIAGNOSIS — Z1231 Encounter for screening mammogram for malignant neoplasm of breast: Secondary | ICD-10-CM

## 2017-09-19 DIAGNOSIS — E119 Type 2 diabetes mellitus without complications: Secondary | ICD-10-CM | POA: Diagnosis not present

## 2017-10-02 ENCOUNTER — Ambulatory Visit
Admission: RE | Admit: 2017-10-02 | Discharge: 2017-10-02 | Disposition: A | Discharge status: Home / Self Care | Payer: 59 | Source: Ambulatory Visit | Attending: Women's Health | Admitting: Women's Health

## 2017-10-02 DIAGNOSIS — Z1231 Encounter for screening mammogram for malignant neoplasm of breast: Secondary | ICD-10-CM

## 2017-10-03 ENCOUNTER — Encounter: Payer: Self-pay | Admitting: Women's Health

## 2018-01-17 DIAGNOSIS — E039 Hypothyroidism, unspecified: Secondary | ICD-10-CM | POA: Diagnosis not present

## 2018-01-17 DIAGNOSIS — E119 Type 2 diabetes mellitus without complications: Secondary | ICD-10-CM | POA: Diagnosis not present

## 2018-01-17 DIAGNOSIS — E041 Nontoxic single thyroid nodule: Secondary | ICD-10-CM | POA: Diagnosis not present

## 2018-01-17 DIAGNOSIS — E785 Hyperlipidemia, unspecified: Secondary | ICD-10-CM | POA: Diagnosis not present

## 2018-02-12 DIAGNOSIS — I10 Essential (primary) hypertension: Secondary | ICD-10-CM | POA: Diagnosis not present

## 2018-02-12 DIAGNOSIS — K219 Gastro-esophageal reflux disease without esophagitis: Secondary | ICD-10-CM | POA: Diagnosis not present

## 2018-03-29 IMAGING — US US THYROID
1 series · 13 of 25 positions shown · non-contrast
Comparison: 04/01/2013; 05/14/2012

CLINICAL DATA: Prior ultrasound follow-up.  Thyroid nodule

EXAM:
THYROID ULTRASOUND
TECHNIQUE: Ultrasound examination of the thyroid gland and adjacent soft
tissues was performed.

[Series 1: us thyroid · 0.05mm/px · 13 of 61 slices shown]
[im 1/61]
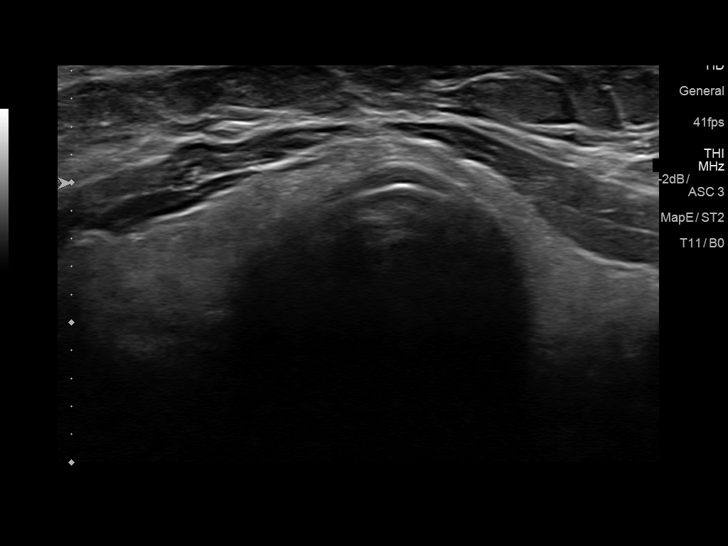
[im 6/61]
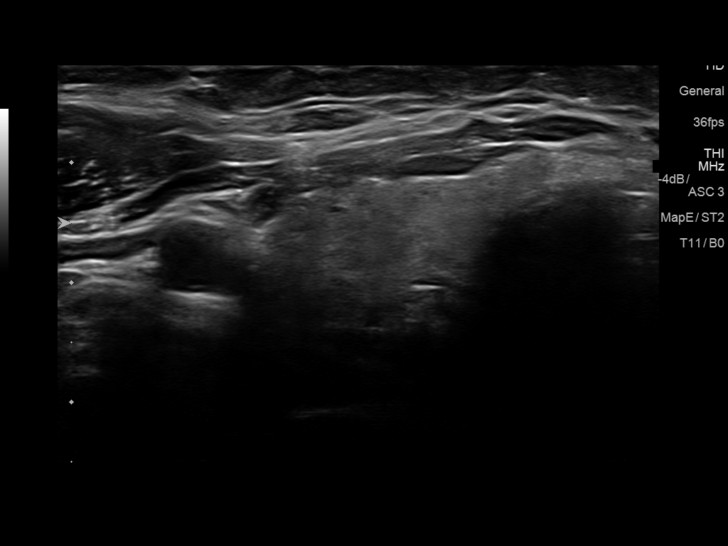
[im 11/61]
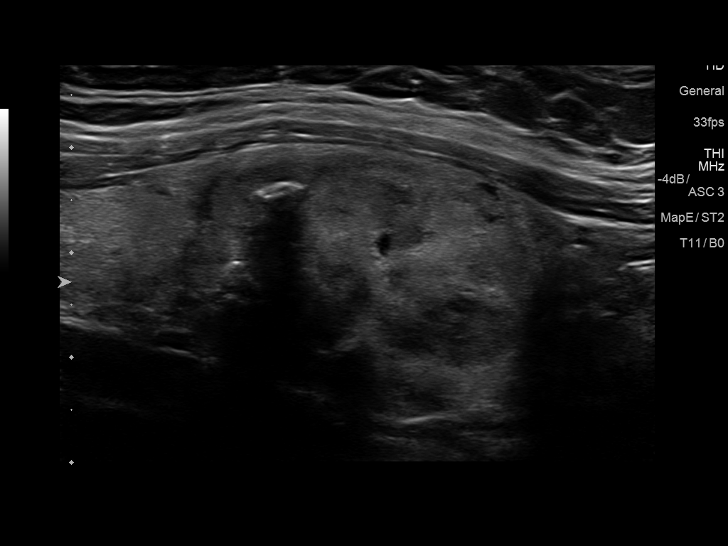
[im 16/61]
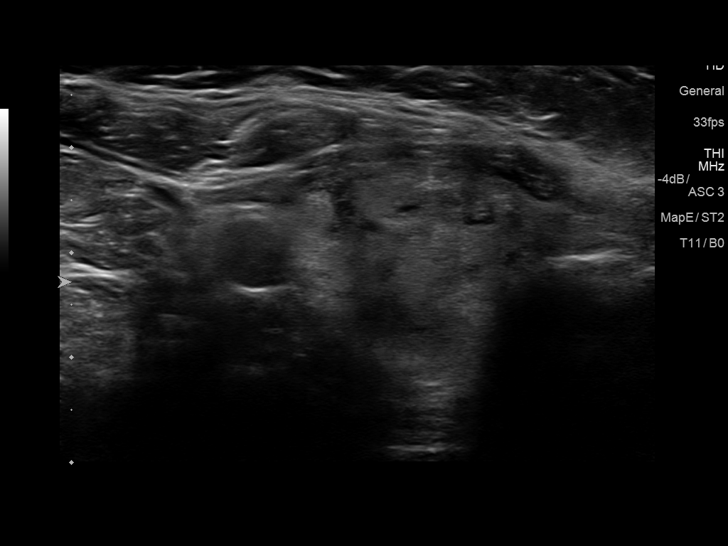
[im 21/61]
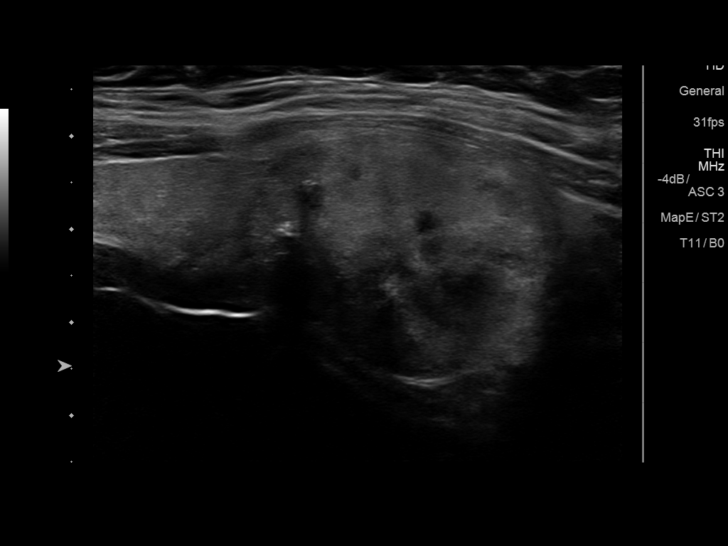
[im 26/61]
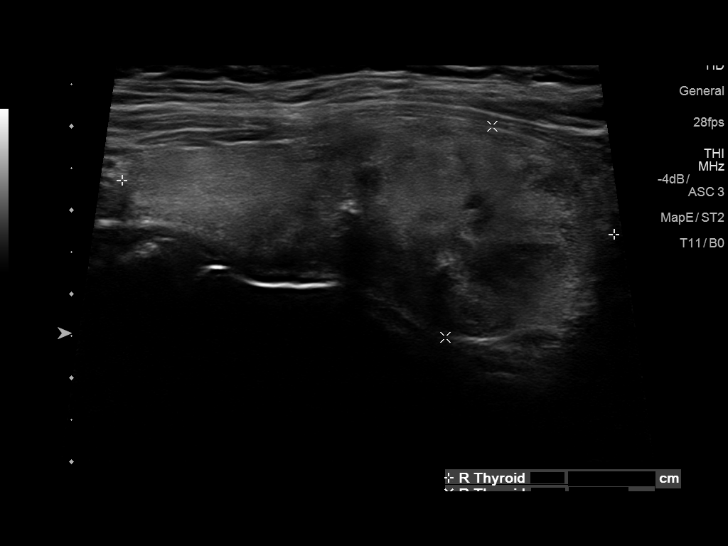
[im 31/61]
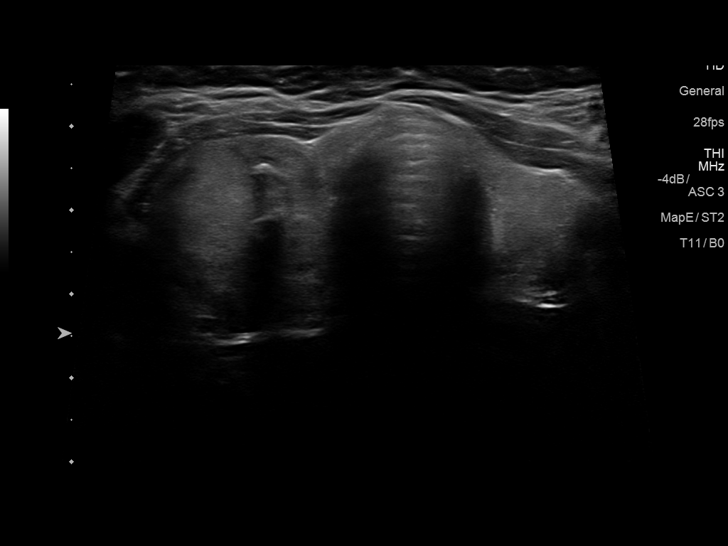
[im 36/61]
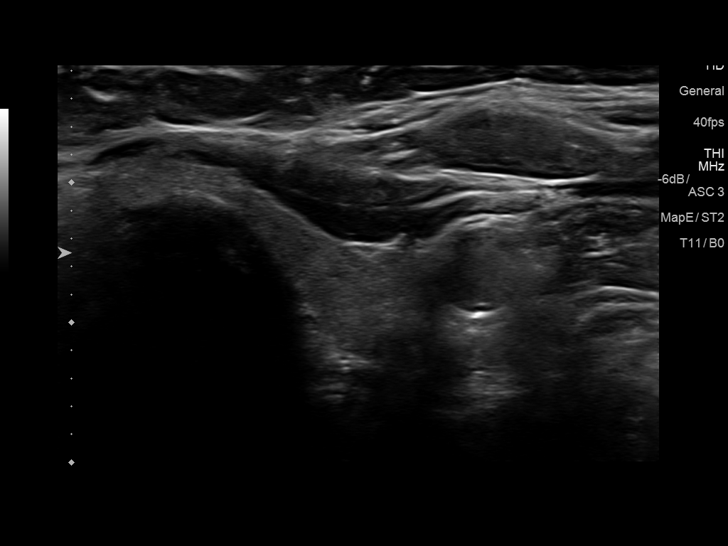
[im 41/61]
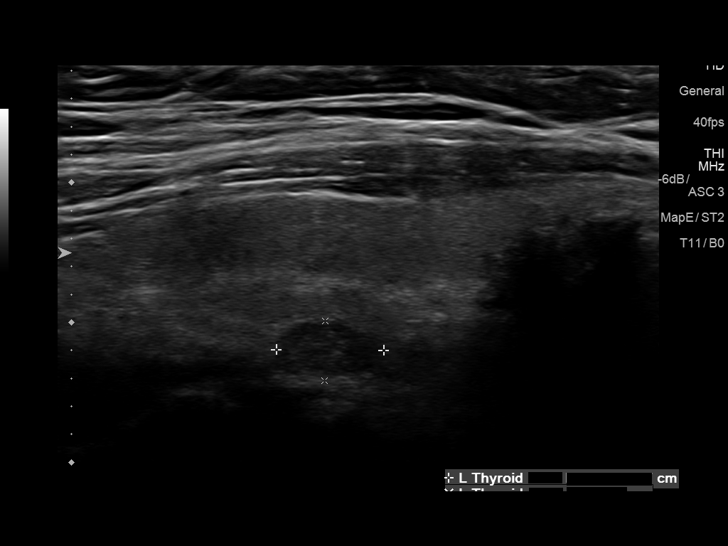
[im 46/61]
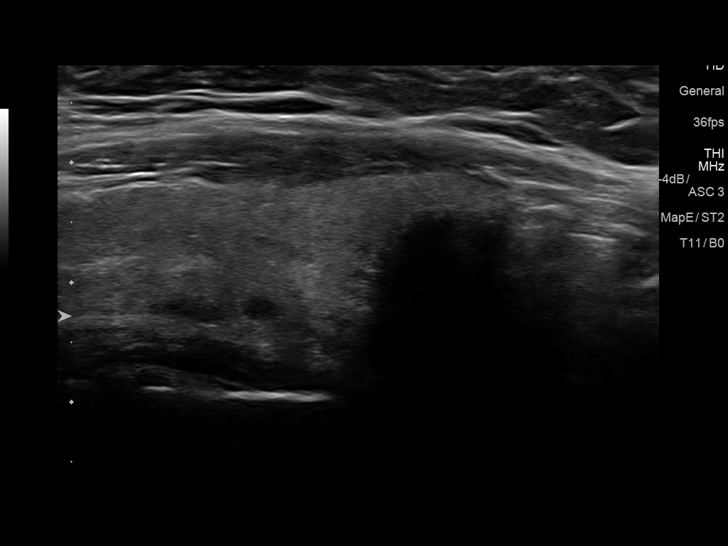
[im 51/61]
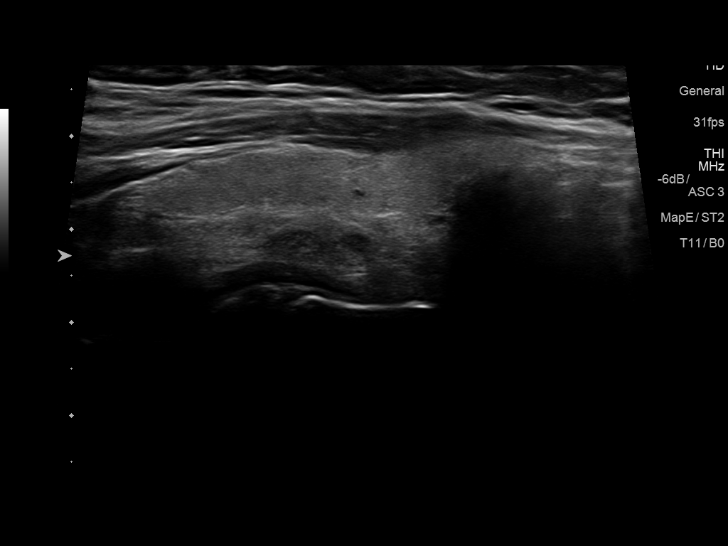
[im 56/61]
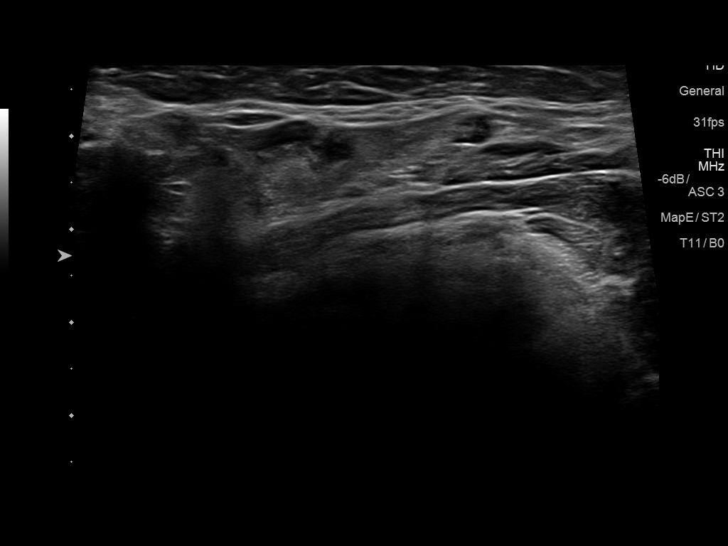
[im 61/61]
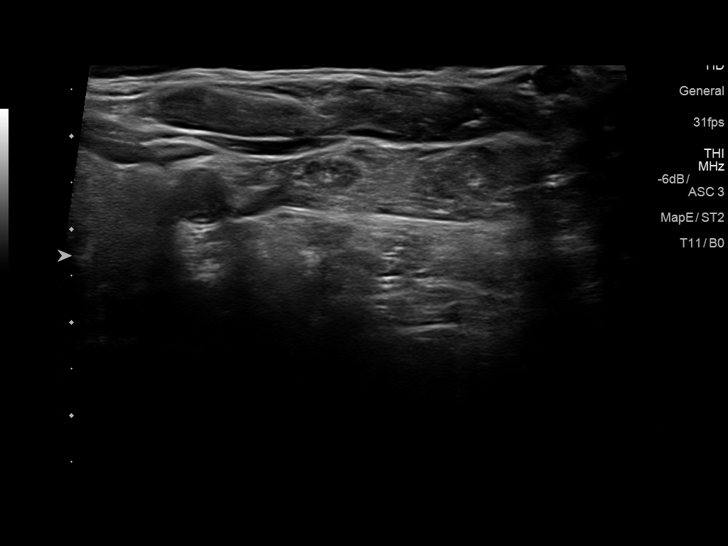

[13 of 25 positions shown; findings below may reference images not displayed]

FINDINGS: Parenchymal Echotexture: Normal

Isthmus: Normal in size measuring 0.3 cm in diameter, unchanged

Right lobe: Borderline enlarged measuring 5.9 x 2.6 x 2.5 cm,
unchanged, previously, 6.2 x 2.8 x 2.7 cm

Left lobe: Normal in size measuring 5.0 x 1.2 x 1.3 cm, unchanged,
previously, 4.9 x 1.2 x 1.2 cm

_________________________________________________________

Estimated total number of nodules >/= 1 cm: 1

Number of spongiform nodules >/=  2 cm not described below (TR1): 0

Number of mixed cystic and solid nodules >/= 1.5 cm not described
below (TR2): 0

_________________________________________________________

The approximately 3.4 x 2.5 x 2.5 cm nodule within the mid/ inferior
pole of the right lobe of the thyroid appears similar to the [DATE]
examination, previously, 3.5 x 2.6 x 2.4 cm when compared to the
[DATE] examination. Stability for 5 years is indicative of benign
etiology.

Nodule # 2 (this nodule is not definitely seen on prior
examinations):

Location: Left; Mid

Maximum size: 0.8 cm; Other 2 dimensions: 0.5 x 0 cm

Composition: solid/almost completely solid (2)

Echogenicity: hypoechoic (2)

Shape: not taller-than-wide (0)

Margins: smooth (0)

Echogenic foci: none (0)

ACR TI-RADS total points: 4.

ACR TI-RADS risk category: TR4 (4-6 points).

ACR TI-RADS recommendations:

Given size (<0.9 cm) and appearance, this nodule does NOT meet
TI-RADS criteria for biopsy or dedicated follow-up.

_________________________________________________________
IMPRESSION: 1. Findings suggestive multinodular goiter.
2. The dominant approximately 3.4 cm nodule/mass within the
mid/inferior aspect of the right lobe of the thyroid is unchanged
compared to the [DATE] examination. Stability for 5 years is
indicative of benign etiology and as such, sampling and/or continued
dedicated follow-up is not recommended.
3. Additional punctate (approximately 0.8 cm) nodule within the left
lobe of the thyroid was not seen on prior examination though does
not meet imaging criteria to recommend percutaneous sampling or
dedicated follow-up.
The above is in keeping with the ACR TI-RADS recommendations - [HOSPITAL] 1376;[DATE].

## 2018-05-17 DIAGNOSIS — Z23 Encounter for immunization: Secondary | ICD-10-CM | POA: Diagnosis not present

## 2018-06-07 ENCOUNTER — Encounter: Payer: Self-pay | Admitting: Women's Health

## 2018-06-07 DIAGNOSIS — E669 Obesity, unspecified: Secondary | ICD-10-CM | POA: Diagnosis not present

## 2018-06-07 DIAGNOSIS — M791 Myalgia, unspecified site: Secondary | ICD-10-CM | POA: Diagnosis not present

## 2018-06-12 ENCOUNTER — Encounter: Payer: Self-pay | Admitting: Women's Health

## 2018-06-12 ENCOUNTER — Ambulatory Visit (INDEPENDENT_AMBULATORY_CARE_PROVIDER_SITE_OTHER): Payer: 59 | Admitting: Women's Health

## 2018-06-12 VITALS — BP 128/78 | Wt 243.0 lb

## 2018-06-12 DIAGNOSIS — Z01419 Encounter for gynecological examination (general) (routine) without abnormal findings: Secondary | ICD-10-CM

## 2018-06-12 MED ORDER — ESTRADIOL 2 MG VA RING: 2.0000 mg | VAGINAL_RING | VAGINAL | 4 refills | Status: DC

## 2018-06-12 NOTE — Progress Notes (Signed)
Amber Choi Apr 02, 1966 [id number]    History:    Presents for annual exam.  Postmenopausal on no HRT with no bleeding.  1997 endometrial ablation.  History of a bicornuate uterus.  Normal Pap and mammogram history.  2017- colonoscopy.  Primary care manages hypertension, Dr. Buddy Duty manages diabetes and hypothyroidism.  Past medical history, past surgical history, family history and social history were all reviewed and documented in the EPIC chart.  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  Had lost 30 pounds last year has regained 10 continues with healthy diet and trying to increase exercise.  Daughter lives in [place].  Son lives local both doing well.  ROS:  A ROS was performed and pertinent positives and negatives are included.  Exam:  Vitals:   06/12/18 1521  BP: 128/78  Weight: 243 lb (110.2 kg)   Body mass index is 41.71 kg/m.   General appearance:  Normal Thyroid:  Symmetrical, normal in size, without palpable masses or nodularity. Respiratory  Auscultation:  Clear without wheezing or rhonchi Cardiovascular  Auscultation:  Regular rate, without rubs, murmurs or gallops  Edema/varicosities:  Not grossly evident Abdominal  Soft,nontender, without masses, guarding or rebound.  Liver/spleen:  No organomegaly noted  Hernia:  None appreciated  Skin  Inspection:  Grossly normal   Breasts: Examined lying and sitting.     Right: Without masses, retractions, discharge or axillary adenopathy.     Left: Without masses, retractions, discharge or axillary adenopathy. Gentitourinary   Inguinal/mons:  Normal without inguinal adenopathy  External genitalia:  Normal  BUS/Urethra/Skene's glands:  Normal  Vagina: Mild atrophy  Cervix:  Normal  Uterus:   normal in size, shape and contour.  Midline and mobile  Adnexa/parametria:     Rt: Without masses or tenderness.   Lt: Without masses or tenderness.  Anus and perineum: Normal  Digital rectal exam: Normal sphincter tone  without palpated masses or tenderness  Assessment/Plan:  52 y.o. MWF G2, P2 for annual exam with complaint of vaginal dryness/pain with intercourse.   Postmenopausal/no HRT/dyspareunia Hypertension/anxiety-situational stress -primary care manages meds Diabetes, hypothyroidism endocrinologist manages meds and labs Obesity  Plan: Continue counseling, reports medication is helping with situational stress, clinical manager at work.  SBE's, continue annual screening mammogram, calcium rich foods, vitamin D 2000 daily encouraged.  Continue to work on increasing regular exercise and decreasing calorie/carbs.  Options for dyspareunia reviewed , will try Estring Estring 2 mg per vagina every 3 months, prescription, handout with coupon given, reviewed minimal systemic absorption.  Will call if continued problems.  Pap normal 2017, new screening guidelines reviewed. [name] [facility], 3:51 PM 06/12/2018

## 2018-06-12 NOTE — Patient Instructions (Signed)

## 2018-06-12 NOTE — Addendum Note (Signed)
Addended by: Joaquin Music on: 06/12/2018 04:11 PM   Modules accepted: Orders

## 2018-08-23 DIAGNOSIS — Z79899 Other long term (current) drug therapy: Secondary | ICD-10-CM | POA: Diagnosis not present

## 2018-08-23 DIAGNOSIS — E785 Hyperlipidemia, unspecified: Secondary | ICD-10-CM | POA: Diagnosis not present

## 2018-08-23 DIAGNOSIS — E041 Nontoxic single thyroid nodule: Secondary | ICD-10-CM | POA: Diagnosis not present

## 2018-08-23 DIAGNOSIS — E039 Hypothyroidism, unspecified: Secondary | ICD-10-CM | POA: Diagnosis not present

## 2018-08-23 DIAGNOSIS — E119 Type 2 diabetes mellitus without complications: Secondary | ICD-10-CM | POA: Diagnosis not present

## 2018-09-19 DIAGNOSIS — H52203 Unspecified astigmatism, bilateral: Secondary | ICD-10-CM | POA: Diagnosis not present

## 2018-09-19 DIAGNOSIS — H5213 Myopia, bilateral: Secondary | ICD-10-CM | POA: Diagnosis not present

## 2018-09-19 DIAGNOSIS — E119 Type 2 diabetes mellitus without complications: Secondary | ICD-10-CM | POA: Diagnosis not present

## 2018-10-08 ENCOUNTER — Other Ambulatory Visit: Payer: Self-pay

## 2018-10-08 DIAGNOSIS — Z1231 Encounter for screening mammogram for malignant neoplasm of breast: Secondary | ICD-10-CM

## 2018-10-21 DIAGNOSIS — R3915 Urgency of urination: Secondary | ICD-10-CM | POA: Diagnosis not present

## 2018-11-04 ENCOUNTER — Ambulatory Visit: Payer: 59

## 2019-03-17 ENCOUNTER — Ambulatory Visit: Payer: 59 | Admitting: Podiatry

## 2019-03-20 ENCOUNTER — Ambulatory Visit: Payer: 59 | Admitting: Podiatry

## 2019-03-25 ENCOUNTER — Other Ambulatory Visit: Payer: Self-pay

## 2019-03-25 ENCOUNTER — Ambulatory Visit (INDEPENDENT_AMBULATORY_CARE_PROVIDER_SITE_OTHER): Payer: 59

## 2019-03-25 ENCOUNTER — Other Ambulatory Visit: Payer: Self-pay | Admitting: Podiatry

## 2019-03-25 ENCOUNTER — Encounter: Payer: Self-pay | Admitting: Podiatry

## 2019-03-25 ENCOUNTER — Ambulatory Visit: Payer: 59 | Admitting: Podiatry

## 2019-03-25 VITALS — BP 142/86 | Temp 98.2°F

## 2019-03-25 DIAGNOSIS — E119 Type 2 diabetes mellitus without complications: Secondary | ICD-10-CM

## 2019-03-25 DIAGNOSIS — Q828 Other specified congenital malformations of skin: Secondary | ICD-10-CM | POA: Diagnosis not present

## 2019-03-25 DIAGNOSIS — M2041 Other hammer toe(s) (acquired), right foot: Secondary | ICD-10-CM

## 2019-03-25 DIAGNOSIS — M79671 Pain in right foot: Secondary | ICD-10-CM

## 2019-03-25 DIAGNOSIS — M205X9 Other deformities of toe(s) (acquired), unspecified foot: Secondary | ICD-10-CM

## 2019-03-25 NOTE — Progress Notes (Signed)
Subjective:   Patient ID: Amber Choi, female   DOB: 53 y.o.   MRN: [id number]   HPI 53 year old female presents the office today for concerns of possible soft tissue mass in the right foot fourth toe, lateral aspect which is been under the last 2 months.  She states does not hurt however she states that it rubs when the fourth and fifth toes rub together.  Denies any open sores.  She is diabetic.  She said that she has been controlled.  Denies any open sores denies any history of ulceration.  Denies any claudication symptoms.  No numbness or tingling.  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   Review of Systems  All other systems reviewed and are negative.  Past Medical History:  Diagnosis Date  . Bicornate uterus   . Diabetes mellitus without complication ([place]) 76/2831  . GERD (gastroesophageal reflux disease)   . Thyroid disease    hypothroid    Past Surgical History:  Procedure Laterality Date  . BREAST EXCISIONAL BIOPSY Left   . BREAST SURGERY     Left breast bx-benign 1986  . CESAREAN SECTION  1988  . CHOLECYSTECTOMY  1990  . ENDOMETRIAL ABLATION  1996   DUB,endometrial polyps  . TUBAL LIGATION  1994  . WISDOM TOOTH EXTRACTION       Current Outpatient Medications:  .  ALPRAZolam (XANAX) 0.25 MG tablet, Take 0.25 mg by mouth as needed.  , Disp: , Rfl:  .  BIOTIN PO, Take 1 tablet by mouth daily., Disp: , Rfl:  .  Calcium Carbonate-Vitamin D (CALCIUM-VITAMIN D) 500-200 MG-UNIT per tablet, Take 1 tablet by mouth daily.  , Disp: , Rfl:  .  citalopram (CELEXA) 10 MG tablet, Take 10 mg by mouth daily., Disp: , Rfl: 12 .  clindamycin (CLEOCIN T) 1 % lotion, , Disp: , Rfl:  .  dexlansoprazole (DEXILANT) 60 MG capsule, Take 60 mg by mouth daily.  , Disp: , Rfl:  .  estradiol (ESTRING) 2 MG vaginal ring, Place 2 mg vaginally every 3 (three) months. follow package directions, Disp: 1 each, Rfl: 4 .  levothyroxine (SYNTHROID, LEVOTHROID) 112 MCG tablet, Take 112 mcg by mouth daily.  SYNTHROID , Disp: , Rfl:  .  losartan-hydrochlorothiazide (HYZAAR) 50-12.5 MG tablet, Take 1 tablet by mouth daily., Disp: , Rfl:  .  meloxicam (MOBIC) 15 MG tablet, Take 1 tablet by mouth as needed., Disp: , Rfl: 12 .  metFORMIN (GLUCOPHAGE-XR) 500 MG 24 hr tablet, Take 1,000 mg by mouth at bedtime. , Disp: , Rfl:  .  Multiple Vitamin (MULTIVITAMIN) capsule, Take 1 capsule by mouth daily., Disp: , Rfl:  .  rosuvastatin (CRESTOR) 10 MG tablet, TAKE 1 TABLET BY MOUTH 2 TIMES A WEEK, Disp: , Rfl:   Allergies  Allergen Reactions  . Empagliflozin Other (See Comments)    Caused yeast infections   . Ketorolac Tromethamine   . Sulfa Antibiotics   . Lisinopril Cough         Objective:  Physical Exam  General: AAO x3, NAD  Dermatological: Small hyperkeratotic lesion the lateral aspect of the right fourth toe on the PIPJ.  No ongoing ulceration.  No open sores.  No swelling or redness.  No signs of infection.  Vascular: Dorsalis Pedis artery and Posterior Tibial artery pedal pulses are 2/4 bilateral with immedate capillary fill time. Pedal hair growth present. No varicosities and no lower extremity edema present bilateral. There is no pain with calf compression, swelling,  warmth, erythema.   Neruologic: Grossly intact via light touch bilateral. Vibratory intact via tuning fork bilateral. Protective threshold with Semmes Wienstein monofilament intact to all pedal sites bilateral.   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  There is also prominence in the left side however no hyperkeratotic tissue.  Gait: Unassisted, Nonantalgic.       Assessment:   Hyperkeratotic and due to digital deformity right side; controlled diabetes-without complication     Plan:  -Treatment options discussed including all alternatives, risks, and complications -Etiology of symptoms were discussed -X-rays were obtained and reviewed with the patient.  No evidence of acute fracture.   Adductovarus is present. -Debrided the hyperkeratotic tissue x1 without any complications or bleeding.  Dispensed offloading pads.  Discussed shoe modifications. -Discussed the importance of daily foot inspection.  She is diabetic.  Recommend least 1 year follow-up.  Trula Slade DPM

## 2019-05-30 ENCOUNTER — Ambulatory Visit: Payer: 59

## 2019-06-18 ENCOUNTER — Encounter: Payer: 59 | Admitting: Women's Health

## 2019-07-15 ENCOUNTER — Other Ambulatory Visit: Payer: Self-pay

## 2019-07-15 ENCOUNTER — Ambulatory Visit
Admission: RE | Admit: 2019-07-15 | Discharge: 2019-07-15 | Disposition: A | Discharge status: Home / Self Care | Payer: 59 | Source: Ambulatory Visit | Attending: Family Medicine | Admitting: Family Medicine

## 2019-07-15 DIAGNOSIS — Z1231 Encounter for screening mammogram for malignant neoplasm of breast: Secondary | ICD-10-CM

## 2019-07-17 ENCOUNTER — Other Ambulatory Visit: Payer: Self-pay

## 2019-09-09 ENCOUNTER — Other Ambulatory Visit: Payer: Self-pay

## 2019-09-10 ENCOUNTER — Encounter: Payer: Self-pay | Admitting: Women's Health

## 2019-09-10 ENCOUNTER — Ambulatory Visit (INDEPENDENT_AMBULATORY_CARE_PROVIDER_SITE_OTHER): Payer: 59 | Admitting: Women's Health

## 2019-09-10 ENCOUNTER — Telehealth: Payer: Self-pay | Admitting: *Deleted

## 2019-09-10 VITALS — BP 126/80 | Ht 64.0 in | Wt 248.0 lb

## 2019-09-10 DIAGNOSIS — E7800 Pure hypercholesterolemia, unspecified: Secondary | ICD-10-CM

## 2019-09-10 DIAGNOSIS — Z01419 Encounter for gynecological examination (general) (routine) without abnormal findings: Secondary | ICD-10-CM

## 2019-09-10 MED ORDER — ESTRING 2 MG VA RING: 2.0000 mg | VAGINAL_RING | VAGINAL | 4 refills | Status: DC

## 2019-09-10 NOTE — Addendum Note (Signed)
Addended by: Lorine Bears on: 09/10/2019 04:41 PM   Modules accepted: Orders

## 2019-09-10 NOTE — Patient Instructions (Signed)
Vit D 2000 iu daily Good to see you today! Health Maintenance for Postmenopausal Women Menopause is a normal process in which your ability to get pregnant comes to an end. This process happens slowly over many months or years, usually between the ages of 23 and 79. Menopause is complete when you have missed your menstrual periods for 12 months. It is important to talk with your health care provider about some of the most common conditions that affect women after menopause (postmenopausal women). These include heart disease, cancer, and bone loss (osteoporosis). Adopting a healthy lifestyle and getting preventive care can help to promote your health and wellness. The actions you take can also lower your chances of developing some of these common conditions. What should I know about menopause? During menopause, you may get a number of symptoms, such as:  Hot flashes. These can be moderate or severe.  Night sweats.  Decrease in sex drive.  Mood swings.  Headaches.  Tiredness.  Irritability.  Memory problems.  Insomnia. Choosing to treat or not to treat these symptoms is a decision that you make with your health care provider. Do I need hormone replacement therapy?  Hormone replacement therapy is effective in treating symptoms that are caused by menopause, such as hot flashes and night sweats.  Hormone replacement carries certain risks, especially as you become older. If you are thinking about using estrogen or estrogen with progestin, discuss the benefits and risks with your health care provider. What is my risk for heart disease and stroke? The risk of heart disease, heart attack, and stroke increases as you age. One of the causes may be a change in the body's hormones during menopause. This can affect how your body uses dietary fats, triglycerides, and cholesterol. Heart attack and stroke are medical emergencies. There are many things that you can do to help prevent heart disease and  stroke. Watch your blood pressure  High blood pressure causes heart disease and increases the risk of stroke. This is more likely to develop in people who have high blood pressure readings, are of African descent, or are overweight.  Have your blood pressure checked: ? Every 3-5 years if you are 7-57 years of age. ? Every year if you are 74 years old or older. Eat a healthy diet   Eat a diet that includes plenty of vegetables, fruits, low-fat dairy products, and lean protein.  Do not eat a lot of foods that are high in solid fats, added sugars, or sodium. Get regular exercise Get regular exercise. This is one of the most important things you can do for your health. Most adults should:  Try to exercise for at least 150 minutes each week. The exercise should increase your heart rate and make you sweat (moderate-intensity exercise).  Try to do strengthening exercises at least twice each week. Do these in addition to the moderate-intensity exercise.  Spend less time sitting. Even light physical activity can be beneficial. Other tips  Work with your health care provider to achieve or maintain a healthy weight.  Do not use any products that contain nicotine or tobacco, such as cigarettes, e-cigarettes, and chewing tobacco. If you need help quitting, ask your health care provider.  Know your numbers. Ask your health care provider to check your cholesterol and your blood sugar (glucose). Continue to have your blood tested as directed by your health care provider. Do I need screening for cancer? Depending on your health history and family history, you may need to  have cancer screening at different stages of your life. This may include screening for:  Breast cancer.  Cervical cancer.  Lung cancer.  Colorectal cancer. What is my risk for osteoporosis? After menopause, you may be at increased risk for osteoporosis. Osteoporosis is a condition in which bone destruction happens more  quickly than new bone creation. To help prevent osteoporosis or the bone fractures that can happen because of osteoporosis, you may take the following actions:  If you are 19-50 years old, get at least 1,000 mg of calcium and at least 600 mg of vitamin D per day.  If you are older than age 50 but younger than age 70, get at least 1,200 mg of calcium and at least 600 mg of vitamin D per day.  If you are older than age 70, get at least 1,200 mg of calcium and at least 800 mg of vitamin D per day. Smoking and drinking excessive alcohol increase the risk of osteoporosis. Eat foods that are rich in calcium and vitamin D, and do weight-bearing exercises several times each week as directed by your health care provider. How does menopause affect my mental health? Depression may occur at any age, but it is more common as you become older. Common symptoms of depression include:  Low or sad mood.  Changes in sleep patterns.  Changes in appetite or eating patterns.  Feeling an overall lack of motivation or enjoyment of activities that you previously enjoyed.  Frequent crying spells. Talk with your health care provider if you think that you are experiencing depression. General instructions See your health care provider for regular wellness exams and vaccines. This may include:  Scheduling regular health, dental, and eye exams.  Getting and maintaining your vaccines. These include: ? Influenza vaccine. Get this vaccine each year before the flu season begins. ? Pneumonia vaccine. ? Shingles vaccine. ? Tetanus, diphtheria, and pertussis (Tdap) booster vaccine. Your health care provider may also recommend other immunizations. Tell your health care provider if you have ever been abused or do not feel safe at home. Summary  Menopause is a normal process in which your ability to get pregnant comes to an end.  This condition causes hot flashes, night sweats, decreased interest in sex, mood swings,  headaches, or lack of sleep.  Treatment for this condition may include hormone replacement therapy.  Take actions to keep yourself healthy, including exercising regularly, eating a healthy diet, watching your weight, and checking your blood pressure and blood sugar levels.  Get screened for cancer and depression. Make sure that you are up to date with all your vaccines. This information is not intended to replace advice given to you by your health care provider. Make sure you discuss any questions you have with your health care provider. Document Revised: 07/24/2018 Document Reviewed: 07/24/2018 Elsevier Patient Education  2020 Elsevier Inc.  

## 2019-09-10 NOTE — Progress Notes (Signed)
Amber Choi 04/15/1966 [id number]    History:    Presents for annual exam.  Postmenopausal with no bleeding on Estring.  Ran out of prescription so has not used in the last few months.  Reports it did help with dryness and some urinary symptoms.  Normal Pap and mammogram history.  Primary care manages diabetes, hypertension, hypercholesteremia, GERD, hypothyroidism.  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  Past medical history, past surgical history, family history and social history were all reviewed and documented in the EPIC chart.  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  Son lives local, daughter lives in [place] has a 51-month-old daughter.  ROS:  A ROS was performed and pertinent positives and negatives are included.  Exam:  Vitals:   09/10/19 1442  BP: 126/80  Weight: 248 lb (112.5 kg)  Height: 5\' 4"  (1.626 m)   Body mass index is 42.57 kg/m.   General appearance:  Normal Thyroid:  Symmetrical, normal in size, without palpable masses or nodularity. Respiratory  Auscultation:  Clear without wheezing or rhonchi Cardiovascular  Auscultation:  Regular rate, without rubs, murmurs or gallops  Edema/varicosities:  Not grossly evident Abdominal  Soft,nontender, without masses, guarding or rebound.  Liver/spleen:  No organomegaly noted  Hernia:  None appreciated  Skin  Inspection:  Grossly normal   Breasts: Examined lying and sitting.     Right: Without masses, retractions, discharge or axillary adenopathy.     Left: Without masses, retractions, discharge or axillary adenopathy. Gentitourinary   Inguinal/mons:  Normal without inguinal adenopathy  External genitalia:  Normal  BUS/Urethra/Skene's glands:  Normal  Vagina:  Normal  Cervix:  Normal  Uterus: normal in size, shape and contour.  Midline and mobile  Adnexa/parametria:     Rt: Without masses or tenderness.   Lt: Without masses or tenderness.  Anus and  perineum: Normal  Digital rectal exam: Normal sphincter tone without palpated masses or tenderness  Assessment/Plan:  54 y.o. MWF G2 P2 for annual exam with no complaints of vaginal discharge, urinary symptoms or dyspareunia.Marland Kitchen  Postmenopausal with no bleeding Hypertension, diabetes, hypercholesteremia, hypothyroidism, GERD, anxiety/depression -primary care manages labs and meds Obesity  Plan: Estring 2 mg q. 3 months per vagina prescription, proper use, reviewed minimal systemic absorption, he did well on any past would like to continue.  SBEs, continue annual 3D screening mammogram, calcium rich foods, vitamin D 2000 IUs daily.  Aware of importance of increasing exercise and decreasing calorie/carbs.  Shingrex reviewed plans to get this year.  Pap with HR HPV typing, 2017 normal Pap new screening guidelines reviewed.    [name] [facility], 3:19 PM 09/10/2019

## 2019-09-10 NOTE — Telephone Encounter (Signed)
PA done via cover my meds for estring 2 mg vaginal ring ,will wait for response.

## 2019-09-11 LAB — PAP, TP IMAGING W/ HPV RNA, RFLX HPV TYPE 16,18/45: HPV DNA High Risk: NOT DETECTED

## 2019-09-11 NOTE — Telephone Encounter (Signed)
Medication approved via [place] [facility] until 08/13/2020

## 2020-03-23 ENCOUNTER — Other Ambulatory Visit: Payer: Self-pay

## 2020-03-23 ENCOUNTER — Ambulatory Visit: Payer: 59 | Admitting: Podiatry

## 2020-03-23 DIAGNOSIS — M205X9 Other deformities of toe(s) (acquired), unspecified foot: Secondary | ICD-10-CM

## 2020-03-23 DIAGNOSIS — E119 Type 2 diabetes mellitus without complications: Secondary | ICD-10-CM | POA: Diagnosis not present

## 2020-03-23 DIAGNOSIS — Q828 Other specified congenital malformations of skin: Secondary | ICD-10-CM | POA: Diagnosis not present

## 2020-03-23 NOTE — Patient Instructions (Signed)
Diabetes Mellitus and Foot Care Foot care is an important part of your health, especially when you have diabetes. Diabetes may cause you to have problems because of poor blood flow (circulation) to your feet and legs, which can cause your skin to:  Become thinner and drier.  Break more easily.  Heal more slowly.  Peel and crack. You may also have nerve damage (neuropathy) in your legs and feet, causing decreased feeling in them. This means that you may not notice minor injuries to your feet that could lead to more serious problems. Noticing and addressing any potential problems early is the best way to prevent future foot problems. How to care for your feet Foot hygiene  Wash your feet daily with warm water and mild soap. Do not use hot water. Then, pat your feet and the areas between your toes until they are completely dry. Do not soak your feet as this can dry your skin.  Trim your toenails straight across. Do not dig under them or around the cuticle. File the edges of your nails with an emery board or nail file.  Apply a moisturizing lotion or petroleum jelly to the skin on your feet and to dry, brittle toenails. Use lotion that does not contain alcohol and is unscented. Do not apply lotion between your toes. Shoes and socks  Wear clean socks or stockings every day. Make sure they are not too tight. Do not wear knee-high stockings since they may decrease blood flow to your legs.  Wear shoes that fit properly and have enough cushioning. Always look in your shoes before you put them on to be sure there are no objects inside.  To break in new shoes, wear them for just a few hours a day. This prevents injuries on your feet. Wounds, scrapes, corns, and calluses  Check your feet daily for blisters, cuts, bruises, sores, and redness. If you cannot see the bottom of your feet, use a mirror or ask someone for help.  Do not cut corns or calluses or try to remove them with medicine.  If you  find a minor scrape, cut, or break in the skin on your feet, keep it and the skin around it clean and dry. You may clean these areas with mild soap and water. Do not clean the area with peroxide, alcohol, or iodine.  If you have a wound, scrape, corn, or callus on your foot, look at it several times a day to make sure it is healing and not infected. Check for: ? Redness, swelling, or pain. ? Fluid or blood. ? Warmth. ? Pus or a bad smell. General instructions  Do not cross your legs. This may decrease blood flow to your feet.  Do not use heating pads or hot water bottles on your feet. They may burn your skin. If you have lost feeling in your feet or legs, you may not know this is happening until it is too late.  Protect your feet from hot and cold by wearing shoes, such as at the beach or on hot pavement.  Schedule a complete foot exam at least once a year (annually) or more often if you have foot problems. If you have foot problems, report any cuts, sores, or bruises to your health care provider immediately. Contact a health care provider if:  You have a medical condition that increases your risk of infection and you have any cuts, sores, or bruises on your feet.  You have an injury that is not   healing.  You have redness on your legs or feet.  You feel burning or tingling in your legs or feet.  You have pain or cramps in your legs and feet.  Your legs or feet are numb.  Your feet always feel cold.  You have pain around a toenail. Get help right away if:  You have a wound, scrape, corn, or callus on your foot and: ? You have pain, swelling, or redness that gets worse. ? You have fluid or blood coming from the wound, scrape, corn, or callus. ? Your wound, scrape, corn, or callus feels warm to the touch. ? You have pus or a bad smell coming from the wound, scrape, corn, or callus. ? You have a fever. ? You have a red line going up your leg. Summary  Check your feet every day  for cuts, sores, red spots, swelling, and blisters.  Moisturize feet and legs daily.  Wear shoes that fit properly and have enough cushioning.  If you have foot problems, report any cuts, sores, or bruises to your health care provider immediately.  Schedule a complete foot exam at least once a year (annually) or more often if you have foot problems. This information is not intended to replace advice given to you by your health care provider. Make sure you discuss any questions you have with your health care provider. Document Revised: 04/23/2019 Document Reviewed: 09/01/2016 Elsevier Patient Education  2020 Elsevier Inc.  

## 2020-03-31 NOTE — Progress Notes (Signed)
Subjective: 54 year old female presents the office today for diabetic foot evaluation as well as for painful callus on the right fourth toe lateral PIPJ.  No drainage or pus that she has seen and no swelling or redness.  The area started become thickened and tender but otherwise she has been doing well.  Denies any open sores.  No claudication symptoms.  No numbness or tingling.  Denies any systemic complaints such as fevers, chills, nausea, vomiting. No acute changes since last appointment, and no other complaints at this time.   Objective: AAO x3, NAD DP/PT pulses palpable bilaterally, CRT less than 3 seconds Protective sensation intact with Simms Weinstein monofilament,  Hyperkeratotic lesion lateral right fourth toe there is no underlying ulceration drainage or signs of infection but is preulcerative.  Adductovarus present fourth and fifth digits.  MMT 5/5. No pain with calf compression, swelling, warmth, erythema  Assessment: Preulcerative callus right fourth toe; diabetic foot evaluation  Plan: -All treatment options discussed with the patient including all alternatives, risks, complications.  -Debrided hyperkeratotic lesion with any complications or bleeding.  Recommend offloading monitor for signs or symptoms of infection or skin breakdown. -Discussed the importance of inspection. -Patient encouraged to call the office with any questions, concerns, change in symptoms.   Trula Slade DPM

## 2020-05-26 ENCOUNTER — Ambulatory Visit: Payer: 59 | Attending: Internal Medicine

## 2020-05-26 ENCOUNTER — Other Ambulatory Visit (HOSPITAL_COMMUNITY): Payer: Self-pay | Admitting: Internal Medicine

## 2020-05-26 DIAGNOSIS — Z23 Encounter for immunization: Secondary | ICD-10-CM

## 2020-05-26 NOTE — Progress Notes (Signed)
   Covid-19 Vaccination Clinic  Name:  Amber Choi    MRN: [id number] DOB: 1966-06-06  05/26/2020  Ms. Amber Choi was observed post Covid-19 immunization for 15 minutes without incident. She was provided with Vaccine Information Sheet and instruction to access the V-Safe system.   Ms. Amber Choi was instructed to call 911 with any severe reactions post vaccine: Marland Kitchen Difficulty breathing  . Swelling of face and throat  . A fast heartbeat  . A bad rash all over body  . Dizziness and weakness

## 2020-06-30 ENCOUNTER — Ambulatory Visit: Payer: 59

## 2020-07-02 ENCOUNTER — Other Ambulatory Visit (HOSPITAL_COMMUNITY): Payer: Self-pay | Admitting: Internal Medicine

## 2020-09-30 ENCOUNTER — Other Ambulatory Visit: Payer: Self-pay

## 2020-09-30 DIAGNOSIS — Z0000 Encounter for general adult medical examination without abnormal findings: Secondary | ICD-10-CM

## 2020-10-20 IMAGING — MG DIGITAL SCREENING BILAT W/ TOMO W/ CAD
8 series · 8 of 24 positions shown · non-contrast
Comparison: Previous exam(s).

CLINICAL DATA: Screening.

EXAM:
DIGITAL SCREENING BILATERAL MAMMOGRAM WITH TOMO AND CAD

[R MLO synth-2D]
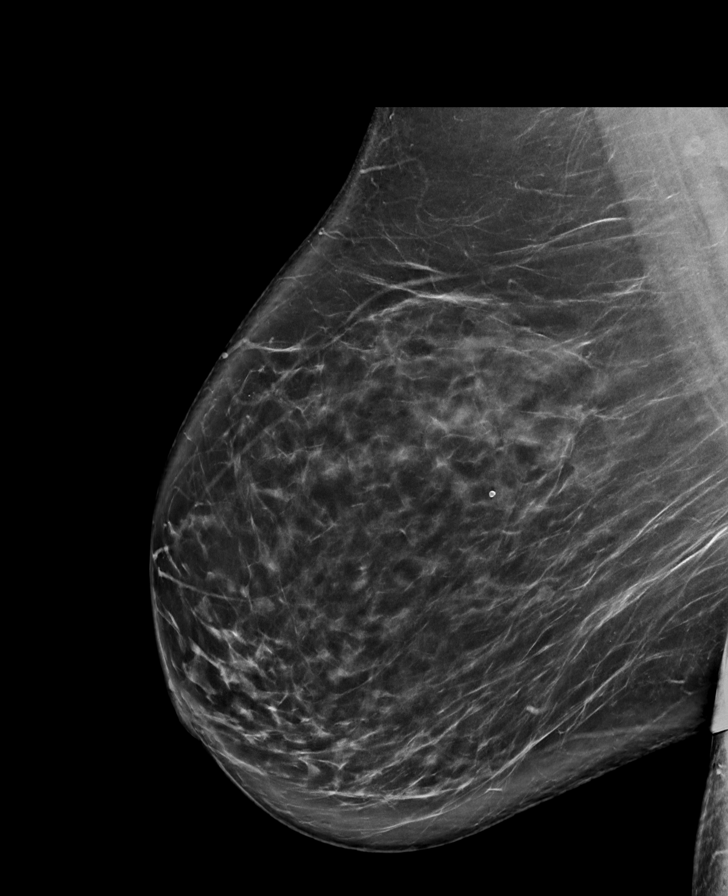

[L MLO synth-2D]
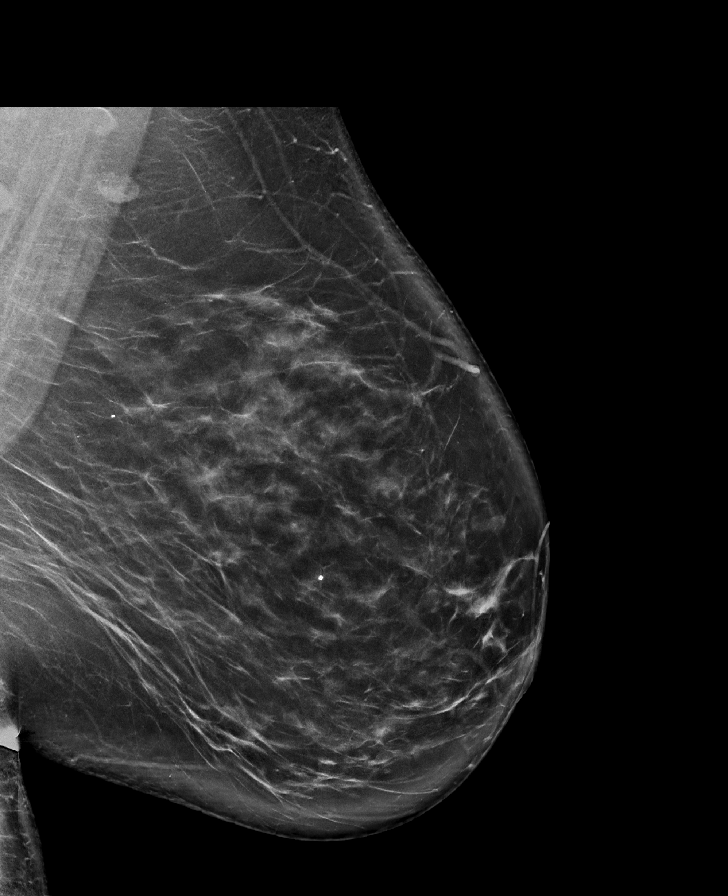

[L CC synth-2D]
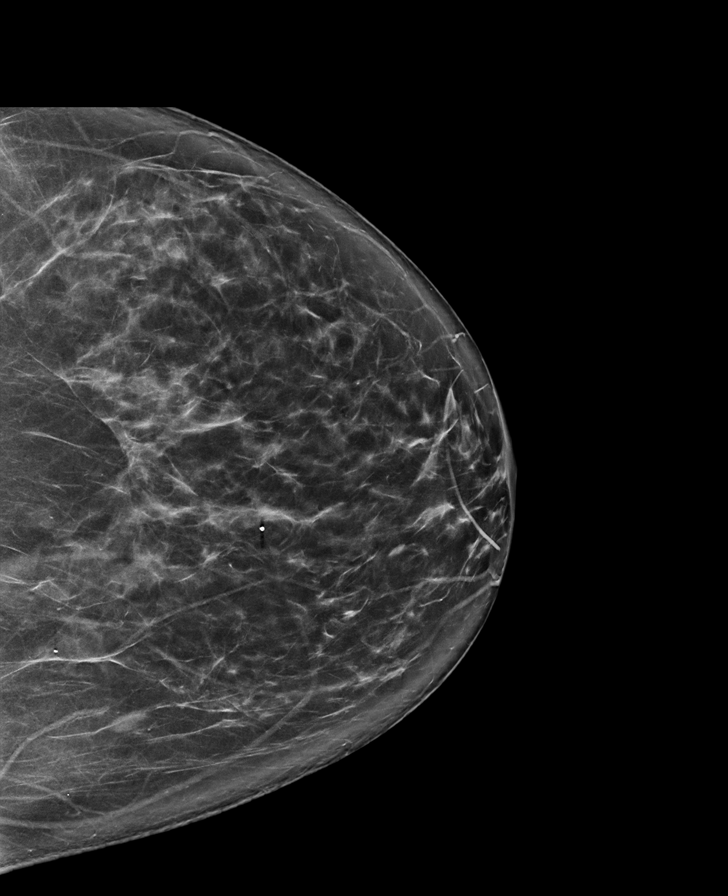

[R CC synth-2D]
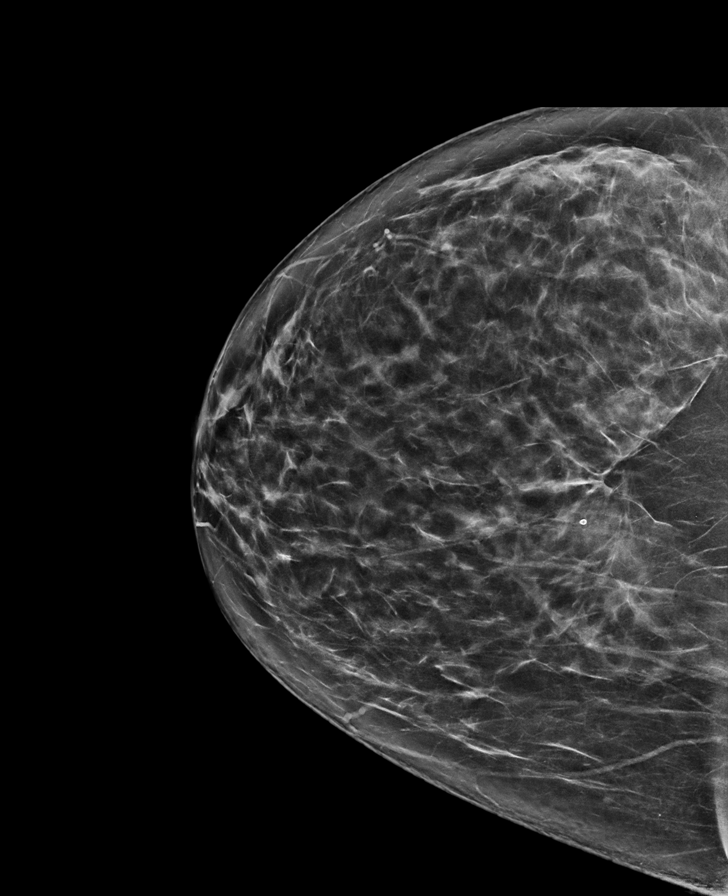

[L MLO tomo · tomo slice 51/101.0]
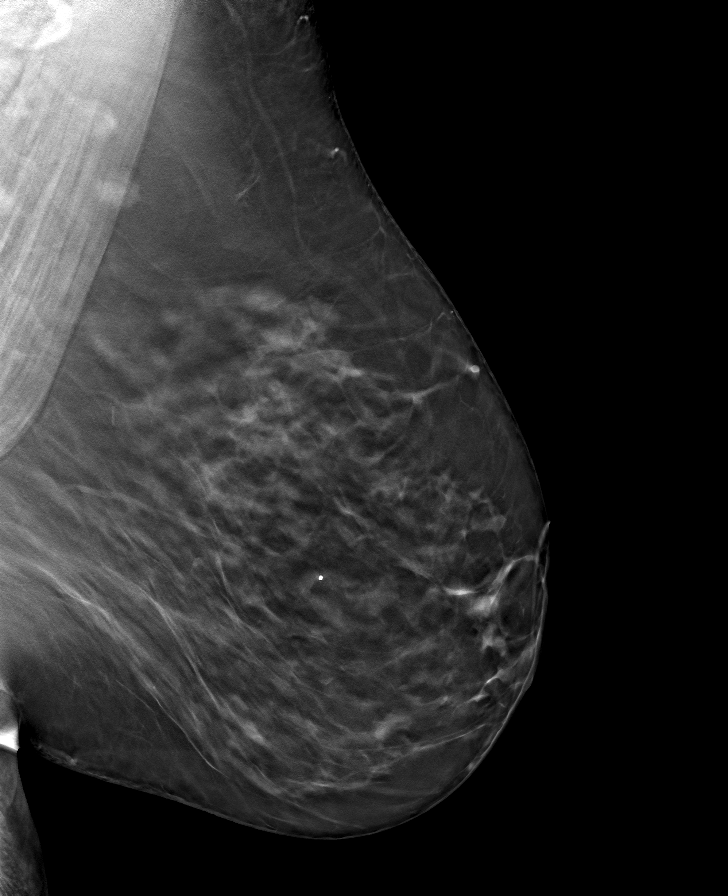

[R CC tomo · tomo slice 43/84.0]
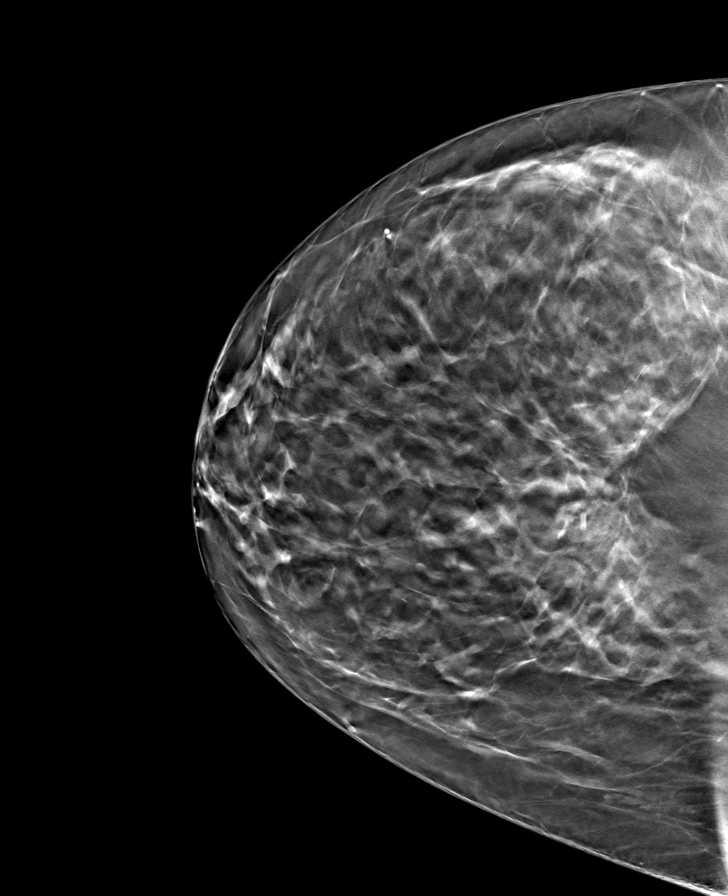

[L CC tomo · tomo slice 44/87.0]
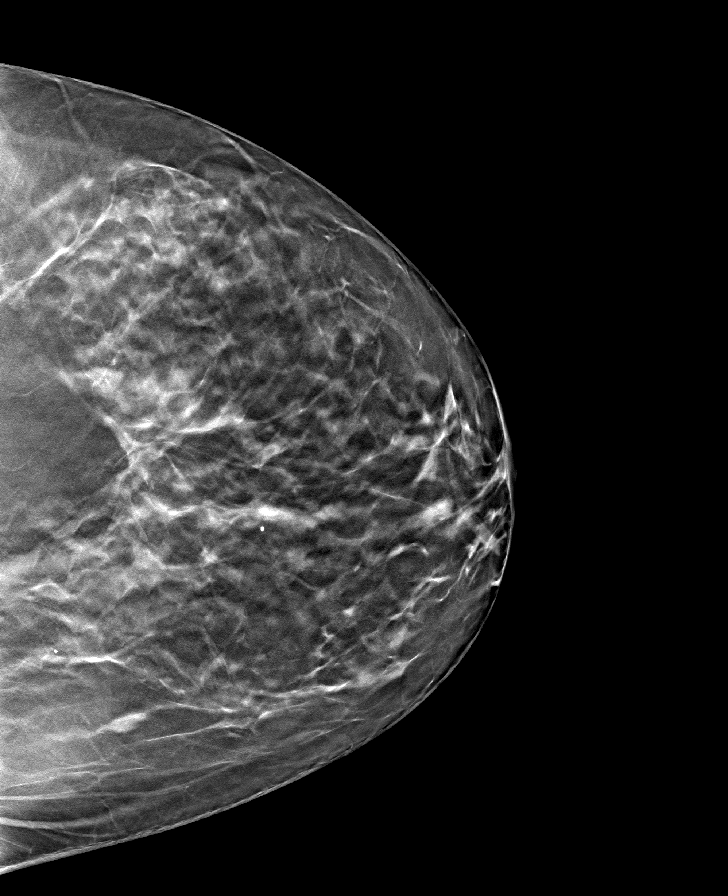

[R MLO tomo · tomo slice 51/100.0]
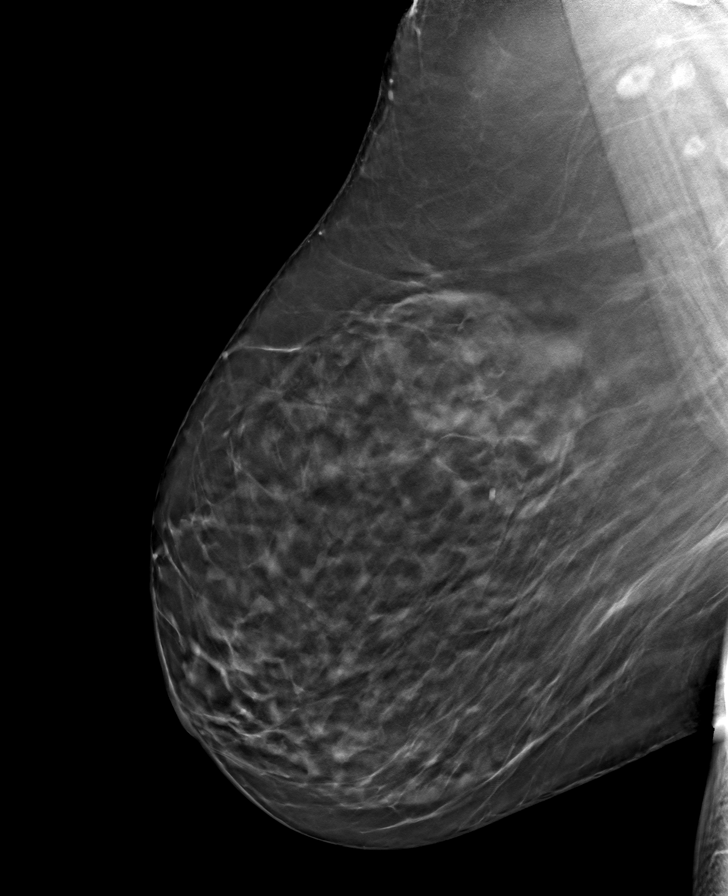

[8 of 24 positions shown; findings below may reference images not displayed]

ACR Breast Density Category c: The breast tissue is heterogeneously
dense, which may obscure small masses.
FINDINGS: There are no findings suspicious for malignancy. Images were
processed with CAD.
IMPRESSION: No mammographic evidence of malignancy. A result letter of this
screening mammogram will be mailed directly to the patient.

RECOMMENDATION:
Screening mammogram in one year. (Code:FT-U-LHB)

BI-RADS CATEGORY  1: Negative.

## 2020-11-11 ENCOUNTER — Other Ambulatory Visit: Payer: Self-pay

## 2020-11-16 ENCOUNTER — Other Ambulatory Visit: Payer: Self-pay

## 2020-11-24 ENCOUNTER — Ambulatory Visit
Admission: RE | Admit: 2020-11-24 | Discharge: 2020-11-24 | Disposition: A | Discharge status: Home / Self Care | Payer: 59 | Source: Ambulatory Visit | Attending: Family Medicine | Admitting: Family Medicine

## 2020-11-24 ENCOUNTER — Other Ambulatory Visit: Payer: Self-pay

## 2020-11-24 DIAGNOSIS — Z0000 Encounter for general adult medical examination without abnormal findings: Secondary | ICD-10-CM

## 2020-12-06 ENCOUNTER — Ambulatory Visit: Payer: 59 | Admitting: Nurse Practitioner

## 2020-12-21 ENCOUNTER — Ambulatory Visit (INDEPENDENT_AMBULATORY_CARE_PROVIDER_SITE_OTHER): Payer: 59 | Admitting: Nurse Practitioner

## 2020-12-21 ENCOUNTER — Encounter: Payer: Self-pay | Admitting: Nurse Practitioner

## 2020-12-21 ENCOUNTER — Other Ambulatory Visit: Payer: Self-pay

## 2020-12-21 VITALS — BP 140/84 | HR 90 | Resp 16 | Ht 63.75 in | Wt 247.0 lb

## 2020-12-21 DIAGNOSIS — Z01419 Encounter for gynecological examination (general) (routine) without abnormal findings: Secondary | ICD-10-CM

## 2020-12-21 DIAGNOSIS — Z780 Asymptomatic menopausal state: Secondary | ICD-10-CM

## 2020-12-21 DIAGNOSIS — N951 Menopausal and female climacteric states: Secondary | ICD-10-CM | POA: Diagnosis not present

## 2020-12-21 MED ORDER — ESTRING 2 MG VA RING: 2.0000 mg | VAGINAL_RING | VAGINAL | 3 refills | Status: DC

## 2020-12-21 NOTE — Patient Instructions (Signed)

## 2020-12-21 NOTE — Progress Notes (Signed)
   Amber Choi May 19, 1966 [id number]   History:  55 y.o. G2P2 presents for annual exam without GYN complaints. Postmenopausal. Using Estring for vaginal dryness with good improvement. Normal pap and mammogram history. Hypothyroidism, DM, HTN managed by PCP. Bicornate uterus.   Gynecologic History Patient's last menstrual period was 05/29/2012.   Contraception/Family planning: post menopausal status  Health Maintenance Last Pap: 09/10/2019. Results were: normal Last mammogram: 11/24/2020. Results were: normal Last colonoscopy: 2017. Results were: normal, 10-year recall Last Dexa: Not indicated  Past medical history, past surgical history, family history and social history were all reviewed and documented in the EPIC chart. Married. Daughter in [place], 23 mo granddaughter. Son lives close. 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    ROS:  A ROS was performed and pertinent positives and negatives are included.  Exam:  Vitals:   12/21/20 1527  BP: 140/84  Pulse: 90  Resp: 16  Weight: 247 lb (112 kg)  Height: 5' 3.75" (1.619 m)   Body mass index is 42.73 kg/m.  General appearance:  Normal Thyroid:  Symmetrical, normal in size, without palpable masses or nodularity. Respiratory  Auscultation:  Clear without wheezing or rhonchi Cardiovascular  Auscultation:  Regular rate, without rubs, murmurs or gallops  Edema/varicosities:  Not grossly evident Abdominal  Soft,nontender, without masses, guarding or rebound.  Liver/spleen:  No organomegaly noted  Hernia:  None appreciated  Skin  Inspection:  Grossly normal Breasts: Examined lying and sitting.   Right: Without masses, retractions, nipple discharge or axillary adenopathy.   Left: Without masses, retractions, nipple discharge or axillary adenopathy. Genitourinary   Inguinal/mons:  Normal without inguinal adenopathy  External genitalia:  Normal appearing vulva with no masses, tenderness, or lesions  BUS/Urethra/Skene's  glands:  Normal  Vagina:  Normal appearing with normal color and discharge, no lesions  Cervix:  Normal appearing without discharge or lesions  Uterus:  Difficult to palpate due to body habitus but no gross masses or tenderness  Adnexa/parametria:     Rt: Normal in size, without masses or tenderness.   Lt: Normal in size, without masses or tenderness.  Anus and perineum: Normal  Digital rectal exam: Normal sphincter tone without palpated masses or tenderness  Assessment/Plan:  55 y.o. G2P2 for annual exam.   Well woman exam with routine gynecological exam - Education provided on SBEs, importance of preventative screenings, current guidelines, high calcium diet, regular exercise, and multivitamin daily. Labs with PCP.   Postmenopausal - no bleeding. Using Estring for vaginal dryness.  Menopausal vaginal dryness - Plan: estradiol (ESTRING) 2 MG vaginal ring. Good improvement with use. Refill x 1 year provided. Husband helps her with exchange.   Screening for cervical cancer - Normal Pap history.  Will repeat at 5-year interval per guidelines.  Screening for breast cancer - Normal mammogram history.  Continue annual screenings.  Normal breast exam today.  Screening for colon cancer - 2017 colonoscopy. Will repeat at GI's recommended interval.   Return in 1 year for annual.    Tamela Gammon DNP, 3:40 PM 12/21/2020

## 2020-12-25 ENCOUNTER — Other Ambulatory Visit: Payer: Self-pay | Admitting: Nurse Practitioner

## 2020-12-25 DIAGNOSIS — N951 Menopausal and female climacteric states: Secondary | ICD-10-CM

## 2020-12-27 ENCOUNTER — Other Ambulatory Visit: Payer: Self-pay | Admitting: Nurse Practitioner

## 2020-12-27 ENCOUNTER — Telehealth: Payer: Self-pay | Admitting: *Deleted

## 2020-12-27 DIAGNOSIS — N952 Postmenopausal atrophic vaginitis: Secondary | ICD-10-CM

## 2020-12-27 DIAGNOSIS — N941 Unspecified dyspareunia: Secondary | ICD-10-CM

## 2020-12-27 MED ORDER — ESTRADIOL 0.1 MG/GM VA CREA: 1.0000 | TOPICAL_CREAM | VAGINAL | 2 refills | Status: DC

## 2020-12-27 NOTE — Telephone Encounter (Signed)
I sent Estradiol vaginal cream to her pharmacy with instructions to use twice weekly at bedtime (2 grams with each application). Thank you.

## 2020-12-27 NOTE — Telephone Encounter (Signed)
PA done via cover my meds for estring 2 mg vaginal ring.  Medication has been denied by [facility] patient will need to try/fail formulary alternatives such as estradiol vaginal cream, Imvexxy, Vagifem.  Left message for patient to call to discuss.

## 2020-12-27 NOTE — Telephone Encounter (Signed)
Amber Choi I called patient and informed her with the below note, she is okay with any of the below medications you recommend. Please advise

## 2021-03-22 ENCOUNTER — Encounter: Payer: Self-pay | Admitting: Podiatry

## 2021-03-22 ENCOUNTER — Other Ambulatory Visit: Payer: Self-pay

## 2021-03-22 ENCOUNTER — Ambulatory Visit: Payer: 59 | Admitting: Podiatry

## 2021-03-22 DIAGNOSIS — E119 Type 2 diabetes mellitus without complications: Secondary | ICD-10-CM | POA: Diagnosis not present

## 2021-03-22 NOTE — Patient Instructions (Signed)

## 2021-03-28 NOTE — Progress Notes (Signed)
Subjective: 55 year old female presents the office today for diabetic foot evaluation.  States that she has been doing well.  She denies any open lesions.  She previously had some Achilles tendinitis and she states that is doing much better long she stretches.  She has no pain to her feet currently.  Her last A1c was around 7 but was rechecked today does not have the results back yet.  Asking for nails be trimmed but not causing significant pain.   Objective: AAO x3, NAD DP/PT pulses palpable bilaterally, CRT less than 3 seconds Protective sensation intact with Simms Weinstein monofilament. Nails appear to be mildly hypertrophic, elongated without any pain to the nails.  No edema, erythema or signs of infection.  There is no open lesion identified bilaterally.  Adductovarus present of the fourth and fifth digits bilaterally.  25/5. No pain with calf compression, swelling, warmth, erythema  Assessment: Diabetic foot evaluation  Plan: -All treatment options discussed with the patient including all alternatives, risks, complications.  -As a courtesy I debrided the nails x10 without any complications or bleeding.  From a diabetes standpoint she is doing well.  Monitor blood sugar daily foot inspection discussed.  Trula Slade DPM

## 2021-11-25 ENCOUNTER — Other Ambulatory Visit: Payer: Self-pay | Admitting: Family Medicine

## 2021-11-25 DIAGNOSIS — Z1231 Encounter for screening mammogram for malignant neoplasm of breast: Secondary | ICD-10-CM

## 2021-11-28 ENCOUNTER — Ambulatory Visit
Admission: RE | Admit: 2021-11-28 | Discharge: 2021-11-28 | Disposition: A | Discharge status: Home / Self Care | Payer: 59 | Source: Ambulatory Visit | Attending: Family Medicine | Admitting: Family Medicine

## 2021-11-28 DIAGNOSIS — Z1231 Encounter for screening mammogram for malignant neoplasm of breast: Secondary | ICD-10-CM

## 2022-03-28 ENCOUNTER — Ambulatory Visit: Payer: 59 | Admitting: Nurse Practitioner

## 2022-05-03 ENCOUNTER — Encounter: Payer: Self-pay | Admitting: Nurse Practitioner

## 2022-05-03 ENCOUNTER — Ambulatory Visit (INDEPENDENT_AMBULATORY_CARE_PROVIDER_SITE_OTHER): Payer: 59 | Admitting: Nurse Practitioner

## 2022-05-03 VITALS — BP 132/72 | HR 87 | Ht 63.75 in | Wt 245.0 lb

## 2022-05-03 DIAGNOSIS — Z780 Asymptomatic menopausal state: Secondary | ICD-10-CM

## 2022-05-03 DIAGNOSIS — Z01419 Encounter for gynecological examination (general) (routine) without abnormal findings: Secondary | ICD-10-CM

## 2022-05-03 DIAGNOSIS — Z8262 Family history of osteoporosis: Secondary | ICD-10-CM

## 2022-05-03 NOTE — Progress Notes (Signed)
   SEVA CHANCY  [birth year] [id number]   History:  56 y.o. G2P2 presents for annual exam without GYN complaints. Postmenopausal - no HRT, no bleeding. Normal pap and mammogram history. Hypothyroidism, DM, HTN managed by PCP. Bicornate uterus.   Gynecologic History Patient's last menstrual period was 05/29/2012.   Contraception/Family planning: post menopausal status Sexually active: infrequently  Health Maintenance Last Pap: 09/10/2019. Results were: Normal neg HPV Last mammogram: 11/28/2021. Results were: Normal Last colonoscopy: 2017. Results were: Normal, 10-year recall Last Dexa: Never  Past medical history, past surgical history, family history and social history were all reviewed and documented in the EPIC chart. Married. New job at [employer]. Worked for [employer] for 30 years. Daughter in [place], 37 yo daughter. Son lives close.     ROS:  A ROS was performed and pertinent positives and negatives are included.  Exam:  Vitals:   05/03/22 0757  BP: 132/72  Pulse: 87  SpO2: 99%  Weight: 245 lb (111.1 kg)  Height: 5' 3.75" (1.619 m)    Body mass index is 42.38 kg/m.  General appearance:  Normal Thyroid:  Symmetrical, normal in size, without palpable masses or nodularity. Respiratory  Auscultation:  Clear without wheezing or rhonchi Cardiovascular  Auscultation:  Regular rate, without rubs, murmurs or gallops  Edema/varicosities:  Not grossly evident Abdominal  Soft,nontender, without masses, guarding or rebound.  Liver/spleen:  No organomegaly noted  Hernia:  None appreciated  Skin  Inspection:  Grossly normal Breasts: Examined lying and sitting.   Right: Without masses, retractions, nipple discharge or axillary adenopathy.   Left: Without masses, retractions, nipple discharge or axillary adenopathy. Genitourinary   Inguinal/mons:  Normal without inguinal adenopathy  External genitalia:  Normal appearing vulva with no masses, tenderness, or  lesions  BUS/Urethra/Skene's glands:  Normal  Vagina:  Normal appearing with normal color and discharge, no lesions  Cervix:  Normal appearing without discharge or lesions  Uterus:  Difficult to palpate due to body habitus but no gross masses or tenderness  Adnexa/parametria:     Rt: Normal in size, without masses or tenderness.   Lt: Normal in size, without masses or tenderness.  Anus and perineum: Normal  Digital rectal exam: Normal sphincter tone without palpated masses or tenderness  Patient informed chaperone available to be present for breast and pelvic exam. Patient has requested no chaperone to be present. Patient has been advised what will be completed during breast and pelvic exam.   Assessment/Plan:  56 y.o. G2P2 for annual exam.   Well woman exam with routine gynecological exam - Education provided on SBEs, importance of preventative screenings, current guidelines, high calcium diet, regular exercise, and multivitamin daily. Labs with PCP.   Postmenopausal - Plan: DG Bone Density. No HRT, no bleeding.  Family history of osteoporosis in mother - Plan: DG Bone Density. Will schedule DXA now. Continue Vitamin D + Calcium supplement. Increase exercise.   Screening for cervical cancer - Normal Pap history.  Will repeat at 5-year interval per guidelines.  Screening for breast cancer - Normal mammogram history.  Continue annual screenings.  Normal breast exam today.  Screening for colon cancer - 2017 colonoscopy. Will repeat at GI's recommended interval.   Return in 1 year for annual.      Tamela Gammon DNP, 8:22 AM 05/03/2022

## 2022-05-17 ENCOUNTER — Ambulatory Visit (INDEPENDENT_AMBULATORY_CARE_PROVIDER_SITE_OTHER): Payer: 59

## 2022-05-17 ENCOUNTER — Other Ambulatory Visit: Payer: Self-pay | Admitting: Nurse Practitioner

## 2022-05-17 DIAGNOSIS — Z780 Asymptomatic menopausal state: Secondary | ICD-10-CM

## 2022-05-17 DIAGNOSIS — Z13820 Encounter for screening for osteoporosis: Secondary | ICD-10-CM | POA: Diagnosis not present

## 2022-05-17 DIAGNOSIS — Z8262 Family history of osteoporosis: Secondary | ICD-10-CM

## 2022-11-13 ENCOUNTER — Other Ambulatory Visit: Payer: Self-pay | Admitting: Family Medicine

## 2022-11-13 DIAGNOSIS — Z1231 Encounter for screening mammogram for malignant neoplasm of breast: Secondary | ICD-10-CM

## 2022-12-13 ENCOUNTER — Ambulatory Visit
Admission: RE | Admit: 2022-12-13 | Discharge: 2022-12-13 | Disposition: A | Discharge status: Home / Self Care | Payer: PRIVATE HEALTH INSURANCE | Source: Ambulatory Visit | Attending: Family Medicine | Admitting: Family Medicine

## 2022-12-13 DIAGNOSIS — Z1231 Encounter for screening mammogram for malignant neoplasm of breast: Secondary | ICD-10-CM

## 2023-05-09 ENCOUNTER — Ambulatory Visit: Payer: 59 | Admitting: Nurse Practitioner

## 2023-12-20 ENCOUNTER — Ambulatory Visit: Payer: 59 | Admitting: Nurse Practitioner

## 2023-12-26 ENCOUNTER — Ambulatory Visit (INDEPENDENT_AMBULATORY_CARE_PROVIDER_SITE_OTHER): Payer: 59 | Admitting: Nurse Practitioner

## 2023-12-26 ENCOUNTER — Other Ambulatory Visit: Payer: Self-pay | Admitting: Family Medicine

## 2023-12-26 ENCOUNTER — Encounter: Payer: Self-pay | Admitting: Nurse Practitioner

## 2023-12-26 VITALS — BP 124/88 | HR 74 | Ht 63.75 in | Wt 225.0 lb

## 2023-12-26 DIAGNOSIS — Z1231 Encounter for screening mammogram for malignant neoplasm of breast: Secondary | ICD-10-CM

## 2023-12-26 DIAGNOSIS — Z1331 Encounter for screening for depression: Secondary | ICD-10-CM

## 2023-12-26 DIAGNOSIS — Z01419 Encounter for gynecological examination (general) (routine) without abnormal findings: Secondary | ICD-10-CM

## 2023-12-26 DIAGNOSIS — Z780 Asymptomatic menopausal state: Secondary | ICD-10-CM

## 2023-12-26 NOTE — Progress Notes (Addendum)
 Amber Choi  [birth year] [id number]   History:  58 y.o. G2P2 presents for annual exam without GYN complaints. Postmenopausal - no HRT, no bleeding. Normal pap and mammogram history. Hypothyroidism, DM, HTN managed by PCP. Bicornate uterus.   Gynecologic History Patient's last menstrual period was 05/29/2012.   Contraception/Family planning: post menopausal status Sexually active: infrequently  Health Maintenance Last Pap: 09/10/2019. Results were: Normal neg HPV Last mammogram: 12/13/2022. Results were: Normal Last colonoscopy: 2017. Results were: Normal, 10-year recall Last Dexa: 05/17/2022. Results were: Normal     12/26/2023    8:09 AM  Depression screen PHQ 2/9  Decreased Interest 0  Down, Depressed, Hopeless 0  PHQ - 2 Score 0     Past medical history, past surgical history, family history and social history were all reviewed and documented in the EPIC chart. Married. 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 Daughter in [place] , 27 yo daughter. Son lives close.     ROS:  A ROS was performed and pertinent positives and negatives are included.  Exam:  Vitals:   12/26/23 0805  BP: 124/88  Pulse: 74  SpO2: 97%  Weight: 225 lb (102.1 kg)  Height: 5' 3.75" (1.619 m)    Body mass index is 38.92 kg/m.  General appearance:  Normal Thyroid :  Symmetrical, normal in size, without palpable masses or nodularity. Respiratory  Auscultation:  Clear without wheezing or rhonchi Cardiovascular  Auscultation:  Regular rate, without rubs, murmurs or gallops  Edema/varicosities:  Not grossly evident Abdominal  Soft,nontender, without masses, guarding or rebound.  Liver/spleen:  No organomegaly noted  Hernia:  None appreciated  Skin  Inspection:  Grossly normal Breasts: Examined lying and sitting.   Right: Without masses, retractions, nipple discharge or axillary adenopathy.   Left: Without masses, retractions, nipple discharge or axillary  adenopathy. Pelvic: External genitalia:  no lesions              Urethra:  normal appearing urethra with no masses, tenderness or lesions              Bartholins and Skenes: normal                 Vagina: normal appearing vagina with normal color and discharge, no lesions              Cervix: no lesions Bimanual Exam:  Uterus:  no masses or tenderness              Adnexa: no mass, fullness, tenderness              Rectovaginal: Deferred              Anus:  normal, no lesions  Patient informed chaperone available to be present for breast and pelvic exam. Patient has requested no chaperone to be present. Patient has been advised what will be completed during breast and pelvic exam.   Assessment/Plan:  58 y.o. G2P2 for annual exam.   Well woman exam with routine gynecological exam - Education provided on SBEs, importance of preventative screenings, current guidelines, high calcium diet, regular exercise, and multivitamin daily. Labs with PCP.   Postmenopausal - No HRT, no bleeding.  Screening for cervical cancer - Normal Pap history.  Will repeat at 5-year interval per guidelines.  Screening for breast cancer - Normal mammogram history.  Continue annual screenings.  Normal breast exam today.  Screening for colon cancer - 2017 colonoscopy. Will repeat at GI's recommended interval.  Return in about 1 year (around 12/25/2024) for Annual.     Andee Bamberger DNP, 8:30 AM 12/26/2023

## 2024-01-21 ENCOUNTER — Ambulatory Visit: Admitting: Podiatry

## 2024-01-22 ENCOUNTER — Ambulatory Visit
Admission: RE | Admit: 2024-01-22 | Discharge: 2024-01-22 | Disposition: A | Discharge status: Home / Self Care | Payer: PRIVATE HEALTH INSURANCE | Source: Ambulatory Visit | Attending: Family Medicine | Admitting: Family Medicine

## 2024-01-22 DIAGNOSIS — Z1231 Encounter for screening mammogram for malignant neoplasm of breast: Secondary | ICD-10-CM

## 2024-02-07 ENCOUNTER — Ambulatory Visit: Admitting: Podiatry

## 2024-02-07 ENCOUNTER — Ambulatory Visit (INDEPENDENT_AMBULATORY_CARE_PROVIDER_SITE_OTHER)

## 2024-02-07 DIAGNOSIS — S90121A Contusion of right lesser toe(s) without damage to nail, initial encounter: Secondary | ICD-10-CM

## 2024-02-07 DIAGNOSIS — M7751 Other enthesopathy of right foot: Secondary | ICD-10-CM | POA: Diagnosis not present

## 2024-02-07 DIAGNOSIS — M2041 Other hammer toe(s) (acquired), right foot: Secondary | ICD-10-CM

## 2024-02-07 DIAGNOSIS — L84 Corns and callosities: Secondary | ICD-10-CM

## 2024-02-07 DIAGNOSIS — M2042 Other hammer toe(s) (acquired), left foot: Secondary | ICD-10-CM

## 2024-02-07 NOTE — Progress Notes (Signed)
  Subjective:  Patient ID: Amber Choi, female    DOB: 11-11-1965,  MRN: [id number]  Chief Complaint  Patient presents with   [facility]    RM#11 DFC/RIGHT FOOT PAIN - Patient injured foot in 09/2023 causing toe pain.    Discussed the use of AI scribe software for clinical note transcription with the patient, who gave verbal consent to proceed.  History of Present Illness Amber Choi is a 58 year old female who presents with right foot pain following an injury in February.  In February, she injured her right foot by hitting it against the head of her bed. The pain is intermittent, localized between the fourth and fifth toes. She occasionally uses a spacer for relief. The pain varies in intensity, sometimes significant while walking, and she experiences a 'pinch that goes up like a rubber band snapping'. There is no consistent pain in the metatarsals.  She has a callus on her foot, previously shaved, with no bleeding or drainage. Her toes appear deformed.      Objective:    Physical Exam General: AAO x3, NAD  Dermatological: Hyperkeratotic lesion right lateral fourth toe without any underlying ulceration, drainage or any signs of infection today.  There are no open lesions.  Vascular: Dorsalis Pedis artery and Posterior Tibial artery pedal pulses are 2/4 bilateral with immedate capillary fill time. There is no pain with calf compression, swelling, warmth, erythema.   Neruologic: Grossly intact via light touch bilateral.   Musculoskeletal: Tenderness to palpation on the hyperkeratotic lesion on the fourth toe.  There is also still tenderness on fifth toe from the injury.  There is some slight edema but there is no erythema or warmth.  Hammertoes are present.   No images are attached to the encounter.    Results  X-rays of the right foot were obtained.  3 views were obtained.  There is no evidence of acute fracture identified at this time.   Assessment:   1.  Contusion of fifth toe of right foot, initial encounter   2. Corns and callosities   3. Hammertoes of both feet      Plan:  Patient was evaluated and treated and all questions answered.  Assessment and Plan Assessment & Plan Foot pain and corn Intermittent pain between fourth and fifth toes with possible bone bruise vs healing/healed fracture. Corn due to pressure-related deformity of toes. - Sharply debrided hyperkeratotic lesion without any complications or bleeding. - Instruct on buddy splinting fourth and fifth toes. - Advise using light toe box to reduce pressure.  No follow-ups on file.   Donnice JONELLE Fees DPM

## 2024-12-30 ENCOUNTER — Ambulatory Visit: Admitting: Nurse Practitioner

## 2025-02-06 ENCOUNTER — Ambulatory Visit: Admitting: Podiatry
